# Patient Record
Sex: Female | Born: 1960 | Race: White | Hispanic: No | Marital: Single
Health system: Southern US, Community
[De-identification: ages and names within clinical notes are randomized; demographics above are authoritative.]

---

## 2010-04-28 ENCOUNTER — Inpatient Hospital Stay: Payer: Self-pay | Admitting: Internal Medicine

## 2010-06-01 ENCOUNTER — Emergency Department: Payer: Self-pay | Admitting: Emergency Medicine

## 2010-06-02 ENCOUNTER — Emergency Department: Payer: Self-pay | Admitting: Emergency Medicine

## 2010-06-04 ENCOUNTER — Inpatient Hospital Stay: Payer: Self-pay | Admitting: Surgery

## 2010-06-08 ENCOUNTER — Inpatient Hospital Stay: Payer: Self-pay | Admitting: Surgery

## 2010-10-22 ENCOUNTER — Emergency Department: Payer: Self-pay | Admitting: Emergency Medicine

## 2011-05-12 ENCOUNTER — Emergency Department: Payer: Self-pay | Admitting: Emergency Medicine

## 2011-05-12 LAB — COMPREHENSIVE METABOLIC PANEL
Albumin: 3.8 g/dL (ref 3.4–5.0)
Alkaline Phosphatase: 92 U/L (ref 50–136)
Anion Gap: 12 (ref 7–16)
BUN: 28 mg/dL — ABNORMAL HIGH (ref 7–18)
Bilirubin,Total: 0.3 mg/dL (ref 0.2–1.0)
Calcium, Total: 9.6 mg/dL (ref 8.5–10.1)
Creatinine: 0.94 mg/dL (ref 0.60–1.30)
EGFR (African American): >60
EGFR (Non-African Amer.): >60
Glucose: 133 mg/dL — ABNORMAL HIGH (ref 65–99)
Osmolality: 291 (ref 275–301)
Potassium: 3.4 mmol/L — ABNORMAL LOW (ref 3.5–5.1)
SGOT(AST): 19 U/L (ref 15–37)
Sodium: 142 mmol/L (ref 136–145)
Total Protein: 7.6 g/dL (ref 6.4–8.2)

## 2011-05-12 LAB — CBC WITH DIFFERENTIAL/PLATELET
Basophil %: 0 %
Eosinophil #: 0 10*3/uL (ref 0.0–0.7)
HCT: 37 % (ref 35.0–47.0)
HGB: 12.7 g/dL (ref 12.0–16.0)
Lymphocyte %: 14.8 %
MCV: 86 fL (ref 80–100)
Monocyte #: 0.1 10*3/uL (ref 0.0–0.7)
Monocyte %: 0.8 %
Neutrophil #: 10.3 10*3/uL — ABNORMAL HIGH (ref 1.4–6.5)
WBC: 12.3 10*3/uL — ABNORMAL HIGH (ref 3.6–11.0)

## 2011-11-06 ENCOUNTER — Inpatient Hospital Stay: Payer: Self-pay | Admitting: Surgery

## 2011-11-06 LAB — URINALYSIS, COMPLETE
Bilirubin,UR: NEGATIVE
Glucose,UR: NEGATIVE mg/dL (ref 0–75)
Hyaline Cast: 51
Protein: 100
Specific Gravity: 1.024 (ref 1.003–1.030)
WBC UR: 2 /HPF (ref 0–5)

## 2011-11-06 LAB — CBC
HCT: 46.3 % (ref 35.0–47.0)
MCHC: 33.6 g/dL (ref 32.0–36.0)
RDW: 14.8 % — ABNORMAL HIGH (ref 11.5–14.5)

## 2011-11-06 LAB — COMPREHENSIVE METABOLIC PANEL
Anion Gap: 24 — ABNORMAL HIGH (ref 7–16)
Calcium, Total: 10.7 mg/dL — ABNORMAL HIGH (ref 8.5–10.1)
Co2: 16 mmol/L — ABNORMAL LOW (ref 21–32)
EGFR (African American): 54 — ABNORMAL LOW
Glucose: 148 mg/dL — ABNORMAL HIGH (ref 65–99)
Osmolality: 272 (ref 275–301)
SGOT(AST): 37 U/L (ref 15–37)
Sodium: 133 mmol/L — ABNORMAL LOW (ref 136–145)

## 2011-11-06 LAB — LIPASE, BLOOD: Lipase: 174 U/L (ref 73–393)

## 2011-11-07 LAB — CBC WITH DIFFERENTIAL/PLATELET
Basophil %: 0.2 %
Eosinophil #: 0 10*3/uL (ref 0.0–0.7)
Eosinophil %: 0.1 %
HCT: 38.1 % (ref 35.0–47.0)
HGB: 12.7 g/dL (ref 12.0–16.0)
MCH: 29.1 pg (ref 26.0–34.0)
MCHC: 33.4 g/dL (ref 32.0–36.0)
Monocyte #: 1.2 x10 3/mm — ABNORMAL HIGH (ref 0.2–0.9)
Monocyte %: 7.3 %
Neutrophil #: 13.2 10*3/uL — ABNORMAL HIGH (ref 1.4–6.5)
Neutrophil %: 79.1 %
RBC: 4.37 10*6/uL (ref 3.80–5.20)

## 2011-11-07 LAB — BASIC METABOLIC PANEL
Anion Gap: 14 (ref 7–16)
BUN: 23 mg/dL — ABNORMAL HIGH (ref 7–18)
Calcium, Total: 8.6 mg/dL (ref 8.5–10.1)
Chloride: 102 mmol/L (ref 98–107)
Creatinine: 1.02 mg/dL (ref 0.60–1.30)
EGFR (African American): >60
EGFR (Non-African Amer.): >60
Osmolality: 285 (ref 275–301)

## 2011-11-08 LAB — CBC WITH DIFFERENTIAL/PLATELET
Basophil #: 0 10*3/uL (ref 0.0–0.1)
Basophil %: 0.3 %
Eosinophil #: 0 10*3/uL (ref 0.0–0.7)
HGB: 11.8 g/dL — ABNORMAL LOW (ref 12.0–16.0)
Lymphocyte #: 3 10*3/uL (ref 1.0–3.6)
Lymphocyte %: 24.7 %
MCH: 29.4 pg (ref 26.0–34.0)
MCHC: 33.8 g/dL (ref 32.0–36.0)
MCV: 87 fL (ref 80–100)
Monocyte #: 0.8 x10 3/mm (ref 0.2–0.9)
Neutrophil %: 67.9 %
Platelet: 175 10*3/uL (ref 150–440)

## 2011-11-08 LAB — BASIC METABOLIC PANEL
BUN: 21 mg/dL — ABNORMAL HIGH (ref 7–18)
Calcium, Total: 8.5 mg/dL (ref 8.5–10.1)
Chloride: 105 mmol/L (ref 98–107)
Co2: 27 mmol/L (ref 21–32)
EGFR (African American): >60
EGFR (Non-African Amer.): >60
Osmolality: 287 (ref 275–301)

## 2011-11-09 LAB — POTASSIUM: Potassium: 3.2 mmol/L — ABNORMAL LOW (ref 3.5–5.1)

## 2011-11-11 LAB — CBC WITH DIFFERENTIAL/PLATELET
Basophil #: 0.1 10*3/uL (ref 0.0–0.1)
Basophil %: 0.8 %
Eosinophil #: 0.3 10*3/uL (ref 0.0–0.7)
HCT: 33.8 % — ABNORMAL LOW (ref 35.0–47.0)
HGB: 11.7 g/dL — ABNORMAL LOW (ref 12.0–16.0)
Lymphocyte #: 2.4 10*3/uL (ref 1.0–3.6)
Lymphocyte %: 28.9 %
MCH: 30 pg (ref 26.0–34.0)
MCHC: 34.6 g/dL (ref 32.0–36.0)
MCV: 87 fL (ref 80–100)
Monocyte #: 0.9 x10 3/mm (ref 0.2–0.9)
Monocyte %: 10.9 %
Neutrophil #: 4.7 10*3/uL (ref 1.4–6.5)
Platelet: 179 10*3/uL (ref 150–440)
RDW: 14 % (ref 11.5–14.5)
WBC: 8.3 10*3/uL (ref 3.6–11.0)

## 2011-11-11 LAB — BASIC METABOLIC PANEL
BUN: 8 mg/dL (ref 7–18)
Calcium, Total: 7.8 mg/dL — ABNORMAL LOW (ref 8.5–10.1)
Chloride: 105 mmol/L (ref 98–107)
EGFR (Non-African Amer.): >60
Glucose: 79 mg/dL (ref 65–99)
Osmolality: 279 (ref 275–301)
Potassium: 3.1 mmol/L — ABNORMAL LOW (ref 3.5–5.1)
Sodium: 141 mmol/L (ref 136–145)

## 2011-11-11 LAB — AMMONIA: Ammonia, Plasma: 26 mcmol/L (ref 11–32)

## 2011-11-11 LAB — MAGNESIUM: Magnesium: 1.6 mg/dL — ABNORMAL LOW

## 2011-11-12 LAB — HEMOGLOBIN: HGB: 11.9 g/dL — ABNORMAL LOW (ref 12.0–16.0)

## 2011-11-12 LAB — MAGNESIUM: Magnesium: 1.8 mg/dL

## 2012-07-28 ENCOUNTER — Emergency Department: Payer: Self-pay | Admitting: Emergency Medicine

## 2012-07-28 LAB — COMPREHENSIVE METABOLIC PANEL
Alkaline Phosphatase: 85 U/L (ref 50–136)
Anion Gap: 18 — ABNORMAL HIGH (ref 7–16)
BUN: 26 mg/dL — ABNORMAL HIGH (ref 7–18)
Bilirubin,Total: 0.5 mg/dL (ref 0.2–1.0)
Chloride: 98 mmol/L (ref 98–107)
Co2: 18 mmol/L — ABNORMAL LOW (ref 21–32)
Creatinine: 0.97 mg/dL (ref 0.60–1.30)
Glucose: 128 mg/dL — ABNORMAL HIGH (ref 65–99)
Osmolality: 275 (ref 275–301)
SGOT(AST): 29 U/L (ref 15–37)
SGPT (ALT): 35 U/L (ref 12–78)
Total Protein: 8.3 g/dL — ABNORMAL HIGH (ref 6.4–8.2)

## 2012-07-28 LAB — URINALYSIS, COMPLETE
Bilirubin,UR: NEGATIVE
Blood: NEGATIVE
Protein: 100
Specific Gravity: 1.03 (ref 1.003–1.030)
Squamous Epithelial: 3
WBC UR: 7 /HPF (ref 0–5)

## 2012-07-28 LAB — DRUG SCREEN, URINE
Cannabinoid 50 Ng, Ur ~~LOC~~: POSITIVE (ref ?–50)
Cocaine Metabolite,Ur ~~LOC~~: NEGATIVE (ref ?–300)
MDMA (Ecstasy)Ur Screen: NEGATIVE (ref ?–500)
Methadone, Ur Screen: NEGATIVE (ref ?–300)
Tricyclic, Ur Screen: NEGATIVE (ref ?–1000)

## 2012-07-28 LAB — CBC
MCH: 29.4 pg (ref 26.0–34.0)
MCV: 86 fL (ref 80–100)
Platelet: 339 10*3/uL (ref 150–440)
RDW: 14.2 % (ref 11.5–14.5)

## 2012-07-28 LAB — PROTIME-INR
INR: 0.9
Prothrombin Time: 12.8 secs (ref 11.5–14.7)

## 2012-07-28 LAB — ETHANOL
Ethanol %: <0.003 % (ref 0.000–0.080)
Ethanol: <3 mg/dL

## 2012-09-15 ENCOUNTER — Emergency Department: Payer: Self-pay | Admitting: Internal Medicine

## 2012-09-15 LAB — COMPREHENSIVE METABOLIC PANEL
Alkaline Phosphatase: 101 U/L (ref 50–136)
BUN: 13 mg/dL (ref 7–18)
Bilirubin,Total: 0.6 mg/dL (ref 0.2–1.0)
Chloride: 99 mmol/L (ref 98–107)
Co2: 21 mmol/L (ref 21–32)
EGFR (Non-African Amer.): 56 — ABNORMAL LOW
Glucose: 154 mg/dL — ABNORMAL HIGH (ref 65–99)
Potassium: 3.3 mmol/L — ABNORMAL LOW (ref 3.5–5.1)

## 2012-09-15 LAB — LIPASE, BLOOD: Lipase: 100 U/L (ref 73–393)

## 2012-09-15 LAB — HEMOGLOBIN: HGB: 14.3 g/dL (ref 12.0–16.0)

## 2012-11-07 ENCOUNTER — Emergency Department: Payer: Self-pay

## 2012-11-07 LAB — URINALYSIS, COMPLETE
Bacteria: NONE SEEN
Bilirubin,UR: NEGATIVE
Glucose,UR: NEGATIVE mg/dL (ref 0–75)
Nitrite: NEGATIVE
Ph: 5 (ref 4.5–8.0)
Protein: 30
Specific Gravity: 1.016 (ref 1.003–1.030)
Squamous Epithelial: <1
WBC UR: 1 /HPF (ref 0–5)

## 2012-11-07 LAB — COMPREHENSIVE METABOLIC PANEL
Anion Gap: 25 — ABNORMAL HIGH (ref 7–16)
Bilirubin,Total: 1 mg/dL (ref 0.2–1.0)
Calcium, Total: 9.4 mg/dL (ref 8.5–10.1)
Chloride: 90 mmol/L — ABNORMAL LOW (ref 98–107)
Co2: 11 mmol/L — ABNORMAL LOW (ref 21–32)
Creatinine: 0.82 mg/dL (ref 0.60–1.30)
EGFR (African American): >60
Glucose: 92 mg/dL (ref 65–99)
Osmolality: 252 (ref 275–301)
SGOT(AST): 50 U/L — ABNORMAL HIGH (ref 15–37)
SGPT (ALT): 36 U/L (ref 12–78)
Total Protein: 8 g/dL (ref 6.4–8.2)

## 2012-11-07 LAB — CBC
HCT: 41.1 % (ref 35.0–47.0)
HGB: 14.4 g/dL (ref 12.0–16.0)
MCHC: 35 g/dL (ref 32.0–36.0)
MCV: 85 fL (ref 80–100)
Platelet: 376 10*3/uL (ref 150–440)
RBC: 4.83 10*6/uL (ref 3.80–5.20)
RDW: 15.7 % — ABNORMAL HIGH (ref 11.5–14.5)
WBC: 16.3 10*3/uL — ABNORMAL HIGH (ref 3.6–11.0)

## 2012-11-07 LAB — ETHANOL: Ethanol %: <0.003 % (ref 0.000–0.080)

## 2012-11-07 LAB — CK TOTAL AND CKMB (NOT AT ARMC): CK-MB: 2.8 ng/mL (ref 0.5–3.6)

## 2012-11-25 ENCOUNTER — Emergency Department: Payer: Self-pay | Admitting: Emergency Medicine

## 2012-11-25 LAB — CBC
HCT: 43.3 % (ref 35.0–47.0)
MCHC: 34.5 g/dL (ref 32.0–36.0)
Platelet: 391 10*3/uL (ref 150–440)
RDW: 16.7 % — ABNORMAL HIGH (ref 11.5–14.5)

## 2012-11-25 LAB — COMPREHENSIVE METABOLIC PANEL
Albumin: 3.8 g/dL (ref 3.4–5.0)
Alkaline Phosphatase: 117 U/L (ref 50–136)
BUN: 6 mg/dL — ABNORMAL LOW (ref 7–18)
Calcium, Total: 9.4 mg/dL (ref 8.5–10.1)
Chloride: 92 mmol/L — ABNORMAL LOW (ref 98–107)
Co2: 20 mmol/L — ABNORMAL LOW (ref 21–32)
EGFR (African American): >60
Glucose: 94 mg/dL (ref 65–99)
Osmolality: 253 (ref 275–301)
Potassium: 3.5 mmol/L (ref 3.5–5.1)
SGOT(AST): 69 U/L — ABNORMAL HIGH (ref 15–37)
Sodium: 127 mmol/L — ABNORMAL LOW (ref 136–145)
Total Protein: 8.1 g/dL (ref 6.4–8.2)

## 2012-11-25 LAB — TSH: Thyroid Stimulating Horm: 5.69 u[IU]/mL — ABNORMAL HIGH

## 2012-11-25 LAB — URINALYSIS, COMPLETE
Bilirubin,UR: NEGATIVE
Blood: NEGATIVE
Nitrite: NEGATIVE
Ph: 7 (ref 4.5–8.0)
RBC,UR: NONE SEEN /HPF (ref 0–5)

## 2012-11-25 LAB — SALICYLATE LEVEL: Salicylates, Serum: 7.5 mg/dL — ABNORMAL HIGH

## 2012-11-25 LAB — ACETAMINOPHEN LEVEL: Acetaminophen: <2 ug/mL

## 2012-11-26 LAB — DRUG SCREEN, URINE
Amphetamines, Ur Screen: NEGATIVE (ref ?–1000)
Benzodiazepine, Ur Scrn: POSITIVE (ref ?–200)
Cannabinoid 50 Ng, Ur ~~LOC~~: POSITIVE (ref ?–50)
Cocaine Metabolite,Ur ~~LOC~~: NEGATIVE (ref ?–300)
MDMA (Ecstasy)Ur Screen: NEGATIVE (ref ?–500)
Methadone, Ur Screen: NEGATIVE (ref ?–300)
Opiate, Ur Screen: NEGATIVE (ref ?–300)

## 2012-11-26 LAB — SALICYLATE LEVEL
Salicylates, Serum: 2.7 mg/dL
Salicylates, Serum: <1.7 mg/dL

## 2013-04-23 ENCOUNTER — Emergency Department: Payer: Self-pay | Admitting: Emergency Medicine

## 2013-04-23 LAB — COMPREHENSIVE METABOLIC PANEL
Albumin: 4.2 g/dL (ref 3.4–5.0)
Alkaline Phosphatase: 103 U/L
Anion Gap: 8 (ref 7–16)
BUN: 15 mg/dL (ref 7–18)
Bilirubin,Total: 0.3 mg/dL (ref 0.2–1.0)
Co2: 27 mmol/L (ref 21–32)
EGFR (African American): >60
Glucose: 139 mg/dL — ABNORMAL HIGH (ref 65–99)
Potassium: 3.2 mmol/L — ABNORMAL LOW (ref 3.5–5.1)
SGOT(AST): 26 U/L (ref 15–37)
SGPT (ALT): 24 U/L (ref 12–78)
Sodium: 133 mmol/L — ABNORMAL LOW (ref 136–145)
Total Protein: 8.4 g/dL — ABNORMAL HIGH (ref 6.4–8.2)

## 2013-04-23 LAB — URINALYSIS, COMPLETE
Bacteria: NONE SEEN
Bilirubin,UR: NEGATIVE
Glucose,UR: NEGATIVE mg/dL (ref 0–75)
Nitrite: NEGATIVE
Squamous Epithelial: <1
WBC UR: 1 /HPF (ref 0–5)

## 2013-04-23 LAB — RAPID INFLUENZA A&B ANTIGENS

## 2013-04-23 LAB — TROPONIN I: Troponin-I: <0.02 ng/mL

## 2013-04-23 LAB — CBC
HGB: 13.7 g/dL (ref 12.0–16.0)
MCH: 27.2 pg (ref 26.0–34.0)
MCHC: 31.5 g/dL — ABNORMAL LOW (ref 32.0–36.0)
Platelet: 321 10*3/uL (ref 150–440)

## 2013-05-20 ENCOUNTER — Emergency Department: Payer: Self-pay | Admitting: Emergency Medicine

## 2013-05-20 LAB — COMPREHENSIVE METABOLIC PANEL
ALBUMIN: 4 g/dL (ref 3.4–5.0)
ALK PHOS: 114 U/L
ANION GAP: 10 (ref 7–16)
AST: 36 U/L (ref 15–37)
BILIRUBIN TOTAL: 0.5 mg/dL (ref 0.2–1.0)
BUN: 16 mg/dL (ref 7–18)
CALCIUM: 9.9 mg/dL (ref 8.5–10.1)
CREATININE: 0.97 mg/dL (ref 0.60–1.30)
Chloride: 94 mmol/L — ABNORMAL LOW (ref 98–107)
Co2: 25 mmol/L (ref 21–32)
EGFR (African American): >60
EGFR (Non-African Amer.): >60
Glucose: 115 mg/dL — ABNORMAL HIGH (ref 65–99)
OSMOLALITY: 261 (ref 275–301)
Potassium: 4.2 mmol/L (ref 3.5–5.1)
SGPT (ALT): 27 U/L (ref 12–78)
Sodium: 129 mmol/L — ABNORMAL LOW (ref 136–145)
Total Protein: 8.8 g/dL — ABNORMAL HIGH (ref 6.4–8.2)

## 2013-05-20 LAB — URINALYSIS, COMPLETE
Bacteria: NONE SEEN
Bilirubin,UR: NEGATIVE
Blood: NEGATIVE
Glucose,UR: NEGATIVE mg/dL (ref 0–75)
Leukocyte Esterase: NEGATIVE
NITRITE: NEGATIVE
Ph: 8 (ref 4.5–8.0)
RBC,UR: 2 /HPF (ref 0–5)
SPECIFIC GRAVITY: 1.018 (ref 1.003–1.030)

## 2013-05-20 LAB — CBC WITH DIFFERENTIAL/PLATELET
BASOS PCT: 0.9 %
Basophil #: 0.1 10*3/uL (ref 0.0–0.1)
EOS PCT: 0.2 %
Eosinophil #: 0 10*3/uL (ref 0.0–0.7)
HCT: 44.7 % (ref 35.0–47.0)
HGB: 15.3 g/dL (ref 12.0–16.0)
LYMPHS ABS: 2 10*3/uL (ref 1.0–3.6)
Lymphocyte %: 14.8 %
MCH: 29.8 pg (ref 26.0–34.0)
MCHC: 34.2 g/dL (ref 32.0–36.0)
MCV: 87 fL (ref 80–100)
Monocyte #: 0.5 x10 3/mm (ref 0.2–0.9)
Monocyte %: 3.4 %
Neutrophil #: 10.9 10*3/uL — ABNORMAL HIGH (ref 1.4–6.5)
Neutrophil %: 80.7 %
Platelet: 323 10*3/uL (ref 150–440)
RBC: 5.13 10*6/uL (ref 3.80–5.20)
RDW: 13.4 % (ref 11.5–14.5)
WBC: 13.5 10*3/uL — ABNORMAL HIGH (ref 3.6–11.0)

## 2013-05-20 LAB — ETHANOL
Ethanol %: <0.003 % (ref 0.000–0.080)
Ethanol: <3 mg/dL

## 2013-05-20 LAB — LIPASE, BLOOD: LIPASE: 201 U/L (ref 73–393)

## 2013-05-25 ENCOUNTER — Emergency Department: Payer: Self-pay | Admitting: Emergency Medicine

## 2013-05-25 LAB — URINALYSIS, COMPLETE
Bilirubin,UR: NEGATIVE
Blood: NEGATIVE
Glucose,UR: NEGATIVE mg/dL (ref 0–75)
Ketone: NEGATIVE
Nitrite: NEGATIVE
Ph: 7 (ref 4.5–8.0)
Protein: NEGATIVE
RBC,UR: 2 /HPF (ref 0–5)
Specific Gravity: 1.003 (ref 1.003–1.030)
Squamous Epithelial: 3

## 2013-05-25 LAB — ACETAMINOPHEN LEVEL: Acetaminophen: <2 ug/mL

## 2013-05-25 LAB — DRUG SCREEN, URINE
AMPHETAMINES, UR SCREEN: NEGATIVE (ref ?–1000)
Barbiturates, Ur Screen: NEGATIVE (ref ?–200)
Benzodiazepine, Ur Scrn: NEGATIVE (ref ?–200)
CANNABINOID 50 NG, UR ~~LOC~~: POSITIVE (ref ?–50)
Cocaine Metabolite,Ur ~~LOC~~: NEGATIVE (ref ?–300)
MDMA (Ecstasy)Ur Screen: NEGATIVE (ref ?–500)
METHADONE, UR SCREEN: NEGATIVE (ref ?–300)
Opiate, Ur Screen: NEGATIVE (ref ?–300)
Phencyclidine (PCP) Ur S: NEGATIVE (ref ?–25)
Tricyclic, Ur Screen: NEGATIVE (ref ?–1000)

## 2013-05-25 LAB — CBC
HCT: 38.1 % (ref 35.0–47.0)
HGB: 12.7 g/dL (ref 12.0–16.0)
MCH: 29.5 pg (ref 26.0–34.0)
MCHC: 33.4 g/dL (ref 32.0–36.0)
MCV: 88 fL (ref 80–100)
PLATELETS: 260 10*3/uL (ref 150–440)
RBC: 4.31 10*6/uL (ref 3.80–5.20)
RDW: 13.7 % (ref 11.5–14.5)
WBC: 12.2 10*3/uL — AB (ref 3.6–11.0)

## 2013-05-25 LAB — COMPREHENSIVE METABOLIC PANEL WITH GFR
Albumin: 3.1 g/dL — ABNORMAL LOW
Alkaline Phosphatase: 104 U/L
Anion Gap: 6 — ABNORMAL LOW
BUN: 10 mg/dL
Bilirubin,Total: 0.2 mg/dL
Calcium, Total: 8.2 mg/dL — ABNORMAL LOW
Chloride: 101 mmol/L
Co2: 26 mmol/L
Creatinine: 0.85 mg/dL
EGFR (African American): >60
EGFR (Non-African Amer.): >60
Glucose: 89 mg/dL
Osmolality: 265
Potassium: 3.5 mmol/L
SGOT(AST): 23 U/L
SGPT (ALT): 19 U/L
Sodium: 133 mmol/L — ABNORMAL LOW
Total Protein: 7.2 g/dL

## 2013-05-25 LAB — SALICYLATE LEVEL: SALICYLATES, SERUM: 2.2 mg/dL

## 2013-05-25 LAB — ETHANOL
Ethanol %: 0.045 % (ref 0.000–0.080)
Ethanol: 45 mg/dL

## 2013-05-28 ENCOUNTER — Emergency Department: Payer: Self-pay | Admitting: Emergency Medicine

## 2013-05-28 LAB — CBC WITH DIFFERENTIAL/PLATELET
Basophil #: 0.1 10*3/uL (ref 0.0–0.1)
Basophil %: 1 %
EOS ABS: 0.3 10*3/uL (ref 0.0–0.7)
Eosinophil %: 3 %
HCT: 35.4 % (ref 35.0–47.0)
HGB: 12.3 g/dL (ref 12.0–16.0)
LYMPHS ABS: 2.7 10*3/uL (ref 1.0–3.6)
Lymphocyte %: 27.8 %
MCH: 30.7 pg (ref 26.0–34.0)
MCHC: 34.9 g/dL (ref 32.0–36.0)
MCV: 88 fL (ref 80–100)
MONO ABS: 0.8 x10 3/mm (ref 0.2–0.9)
MONOS PCT: 7.7 %
NEUTROS ABS: 6 10*3/uL (ref 1.4–6.5)
Neutrophil %: 60.5 %
Platelet: 279 10*3/uL (ref 150–440)
RBC: 4.03 10*6/uL (ref 3.80–5.20)
RDW: 13.5 % (ref 11.5–14.5)
WBC: 9.9 10*3/uL (ref 3.6–11.0)

## 2013-05-28 LAB — COMPREHENSIVE METABOLIC PANEL
ALK PHOS: 92 U/L
Albumin: 3 g/dL — ABNORMAL LOW (ref 3.4–5.0)
Anion Gap: 3 — ABNORMAL LOW (ref 7–16)
BUN: 15 mg/dL (ref 7–18)
Bilirubin,Total: 0.2 mg/dL (ref 0.2–1.0)
CHLORIDE: 99 mmol/L (ref 98–107)
Calcium, Total: 9.1 mg/dL (ref 8.5–10.1)
Co2: 30 mmol/L (ref 21–32)
Creatinine: 1.14 mg/dL (ref 0.60–1.30)
EGFR (African American): >60
GFR CALC NON AF AMER: 55 — AB
Glucose: 83 mg/dL (ref 65–99)
OSMOLALITY: 264 (ref 275–301)
POTASSIUM: 4.2 mmol/L (ref 3.5–5.1)
SGOT(AST): 20 U/L (ref 15–37)
SGPT (ALT): 15 U/L (ref 12–78)
SODIUM: 132 mmol/L — AB (ref 136–145)
TOTAL PROTEIN: 7.2 g/dL (ref 6.4–8.2)

## 2013-06-05 ENCOUNTER — Ambulatory Visit: Payer: Self-pay | Admitting: Otolaryngology

## 2013-07-01 ENCOUNTER — Inpatient Hospital Stay: Payer: Self-pay | Admitting: Internal Medicine

## 2013-07-01 LAB — CBC
HCT: 44.5 % (ref 35.0–47.0)
HGB: 14.9 g/dL (ref 12.0–16.0)
MCH: 28.7 pg (ref 26.0–34.0)
MCHC: 33.5 g/dL (ref 32.0–36.0)
MCV: 86 fL (ref 80–100)
Platelet: 352 10*3/uL (ref 150–440)
RBC: 5.19 10*6/uL (ref 3.80–5.20)
RDW: 13.2 % (ref 11.5–14.5)
WBC: 14.6 10*3/uL — ABNORMAL HIGH (ref 3.6–11.0)

## 2013-07-01 LAB — DRUG SCREEN, URINE
AMPHETAMINES, UR SCREEN: NEGATIVE (ref ?–1000)
Barbiturates, Ur Screen: NEGATIVE (ref ?–200)
Benzodiazepine, Ur Scrn: NEGATIVE (ref ?–200)
COCAINE METABOLITE, UR ~~LOC~~: NEGATIVE (ref ?–300)
Cannabinoid 50 Ng, Ur ~~LOC~~: POSITIVE (ref ?–50)
MDMA (ECSTASY) UR SCREEN: NEGATIVE (ref ?–500)
Methadone, Ur Screen: NEGATIVE (ref ?–300)
Opiate, Ur Screen: NEGATIVE (ref ?–300)
PHENCYCLIDINE (PCP) UR S: NEGATIVE (ref ?–25)
Tricyclic, Ur Screen: POSITIVE (ref ?–1000)

## 2013-07-01 LAB — COMPREHENSIVE METABOLIC PANEL
ALK PHOS: 136 U/L — AB
ALT: 32 U/L (ref 12–78)
ANION GAP: 9 (ref 7–16)
AST: 80 U/L — AB (ref 15–37)
Albumin: 3.7 g/dL (ref 3.4–5.0)
BUN: 30 mg/dL — ABNORMAL HIGH (ref 7–18)
Bilirubin,Total: 0.5 mg/dL (ref 0.2–1.0)
CO2: 26 mmol/L (ref 21–32)
CREATININE: 1.24 mg/dL (ref 0.60–1.30)
Calcium, Total: 9.2 mg/dL (ref 8.5–10.1)
Chloride: 100 mmol/L (ref 98–107)
EGFR (African American): 57 — ABNORMAL LOW
GFR CALC NON AF AMER: 50 — AB
Glucose: 117 mg/dL — ABNORMAL HIGH (ref 65–99)
OSMOLALITY: 277 (ref 275–301)
POTASSIUM: 3.6 mmol/L (ref 3.5–5.1)
Sodium: 135 mmol/L — ABNORMAL LOW (ref 136–145)
TOTAL PROTEIN: 8.3 g/dL — AB (ref 6.4–8.2)

## 2013-07-01 LAB — TROPONIN I: Troponin-I: <0.02 ng/mL

## 2013-07-01 LAB — URINALYSIS, COMPLETE
Bacteria: NONE SEEN
Bilirubin,UR: NEGATIVE
Blood: NEGATIVE
Glucose,UR: NEGATIVE mg/dL (ref 0–75)
Hyaline Cast: 3
Nitrite: NEGATIVE
Ph: 5 (ref 4.5–8.0)
Protein: 30
RBC,UR: 1 /HPF (ref 0–5)
Specific Gravity: 1.031 (ref 1.003–1.030)
Squamous Epithelial: <1
WBC UR: 2 /HPF (ref 0–5)

## 2013-07-01 LAB — TRIGLYCERIDES: Triglycerides: 197 mg/dL (ref 0–200)

## 2013-07-01 LAB — MAGNESIUM: MAGNESIUM: 1.7 mg/dL — AB

## 2013-07-01 LAB — ETHANOL: Ethanol: <3 mg/dL

## 2013-07-01 LAB — LIPASE, BLOOD: Lipase: >10000 U/L — ABNORMAL HIGH (ref 73–393)

## 2013-07-02 LAB — CBC WITH DIFFERENTIAL/PLATELET
BASOS ABS: 0 10*3/uL (ref 0.0–0.1)
Basophil %: 0.2 %
EOS ABS: 1.5 10*3/uL — AB (ref 0.0–0.7)
Eosinophil %: 14.9 %
HCT: 37.1 % (ref 35.0–47.0)
HGB: 12.8 g/dL (ref 12.0–16.0)
Lymphocyte #: 1.8 10*3/uL (ref 1.0–3.6)
Lymphocyte %: 18.2 %
MCH: 29.5 pg (ref 26.0–34.0)
MCHC: 34.6 g/dL (ref 32.0–36.0)
MCV: 85 fL (ref 80–100)
Monocyte #: 0.4 x10 3/mm (ref 0.2–0.9)
Monocyte %: 4.4 %
Neutrophil #: 6.3 10*3/uL (ref 1.4–6.5)
Neutrophil %: 62.3 %
PLATELETS: 265 10*3/uL (ref 150–440)
RBC: 4.35 10*6/uL (ref 3.80–5.20)
RDW: 13.4 % (ref 11.5–14.5)
WBC: 10.1 10*3/uL (ref 3.6–11.0)

## 2013-07-02 LAB — BASIC METABOLIC PANEL
Anion Gap: 5 — ABNORMAL LOW (ref 7–16)
BUN: 26 mg/dL — ABNORMAL HIGH (ref 7–18)
CALCIUM: 8.6 mg/dL (ref 8.5–10.1)
CO2: 28 mmol/L (ref 21–32)
Chloride: 105 mmol/L (ref 98–107)
Creatinine: 0.88 mg/dL (ref 0.60–1.30)
EGFR (African American): >60
EGFR (Non-African Amer.): >60
GLUCOSE: 85 mg/dL (ref 65–99)
OSMOLALITY: 280 (ref 275–301)
Potassium: 3.8 mmol/L (ref 3.5–5.1)
SODIUM: 138 mmol/L (ref 136–145)

## 2013-07-02 LAB — LIPASE, BLOOD: Lipase: 1391 U/L — ABNORMAL HIGH (ref 73–393)

## 2013-07-03 LAB — HEPATIC FUNCTION PANEL A (ARMC)
ALK PHOS: 128 U/L — AB
ALT: 71 U/L (ref 12–78)
AST: 54 U/L — AB (ref 15–37)
Albumin: 2.8 g/dL — ABNORMAL LOW (ref 3.4–5.0)
BILIRUBIN TOTAL: 0.3 mg/dL (ref 0.2–1.0)
TOTAL PROTEIN: 5.7 g/dL — AB (ref 6.4–8.2)

## 2013-07-03 LAB — LIPASE, BLOOD: LIPASE: 305 U/L (ref 73–393)

## 2013-07-15 ENCOUNTER — Inpatient Hospital Stay: Payer: Self-pay | Admitting: Otolaryngology

## 2013-07-18 LAB — PATHOLOGY REPORT

## 2013-07-25 ENCOUNTER — Ambulatory Visit: Payer: Self-pay | Admitting: Oncology

## 2013-07-30 ENCOUNTER — Ambulatory Visit: Payer: Self-pay | Admitting: Oncology

## 2013-08-19 ENCOUNTER — Ambulatory Visit: Payer: Self-pay | Admitting: Oncology

## 2013-08-21 ENCOUNTER — Ambulatory Visit: Payer: Self-pay | Admitting: Vascular Surgery

## 2013-08-22 ENCOUNTER — Ambulatory Visit: Payer: Self-pay | Admitting: Oncology

## 2013-08-29 ENCOUNTER — Ambulatory Visit: Payer: Self-pay | Admitting: Oncology

## 2013-09-30 ENCOUNTER — Ambulatory Visit: Payer: Self-pay | Admitting: Oncology

## 2013-10-06 LAB — COMPREHENSIVE METABOLIC PANEL
ALT: 20 U/L (ref 12–78)
ANION GAP: 9 (ref 7–16)
Albumin: 3.5 g/dL (ref 3.4–5.0)
Alkaline Phosphatase: 88 U/L
BUN: 15 mg/dL (ref 7–18)
Bilirubin,Total: 0.3 mg/dL (ref 0.2–1.0)
CHLORIDE: 100 mmol/L (ref 98–107)
CREATININE: 1.04 mg/dL (ref 0.60–1.30)
Calcium, Total: 9.2 mg/dL (ref 8.5–10.1)
Co2: 27 mmol/L (ref 21–32)
EGFR (Non-African Amer.): >60
Glucose: 88 mg/dL (ref 65–99)
Osmolality: 272 (ref 275–301)
Potassium: 4.3 mmol/L (ref 3.5–5.1)
SGOT(AST): 15 U/L (ref 15–37)
SODIUM: 136 mmol/L (ref 136–145)
Total Protein: 7.2 g/dL (ref 6.4–8.2)

## 2013-10-06 LAB — CBC CANCER CENTER
BASOS ABS: 0.1 x10 3/mm (ref 0.0–0.1)
Basophil %: 1.1 %
Eosinophil #: 0.4 x10 3/mm (ref 0.0–0.7)
Eosinophil %: 4.8 %
HCT: 35.6 % (ref 35.0–47.0)
HGB: 12.2 g/dL (ref 12.0–16.0)
Lymphocyte #: 2.6 x10 3/mm (ref 1.0–3.6)
Lymphocyte %: 31.3 %
MCH: 28.4 pg (ref 26.0–34.0)
MCHC: 34.3 g/dL (ref 32.0–36.0)
MCV: 83 fL (ref 80–100)
Monocyte #: 0.5 x10 3/mm (ref 0.2–0.9)
Monocyte %: 6.2 %
NEUTROS PCT: 56.6 %
Neutrophil #: 4.6 x10 3/mm (ref 1.4–6.5)
PLATELETS: 296 x10 3/mm (ref 150–440)
RBC: 4.32 10*6/uL (ref 3.80–5.20)
RDW: 14.3 % (ref 11.5–14.5)
WBC: 8.2 x10 3/mm (ref 3.6–11.0)

## 2013-10-13 LAB — COMPREHENSIVE METABOLIC PANEL
Albumin: 3 g/dL — ABNORMAL LOW (ref 3.4–5.0)
Alkaline Phosphatase: 85 U/L
Anion Gap: 6 — ABNORMAL LOW (ref 7–16)
BILIRUBIN TOTAL: 0.2 mg/dL (ref 0.2–1.0)
BUN: 17 mg/dL (ref 7–18)
CALCIUM: 8.7 mg/dL (ref 8.5–10.1)
CHLORIDE: 101 mmol/L (ref 98–107)
Co2: 29 mmol/L (ref 21–32)
Creatinine: 0.85 mg/dL (ref 0.60–1.30)
EGFR (African American): >60
EGFR (Non-African Amer.): >60
Glucose: 95 mg/dL (ref 65–99)
Osmolality: 273 (ref 275–301)
Potassium: 4 mmol/L (ref 3.5–5.1)
SGOT(AST): 10 U/L — ABNORMAL LOW (ref 15–37)
SGPT (ALT): 16 U/L (ref 12–78)
Sodium: 136 mmol/L (ref 136–145)
TOTAL PROTEIN: 6.5 g/dL (ref 6.4–8.2)

## 2013-10-13 LAB — CBC CANCER CENTER
Basophil #: 0.1 x10 3/mm (ref 0.0–0.1)
Basophil %: 0.8 %
EOS ABS: 0.3 x10 3/mm (ref 0.0–0.7)
Eosinophil %: 2.5 %
HCT: 33.4 % — ABNORMAL LOW (ref 35.0–47.0)
HGB: 11.2 g/dL — ABNORMAL LOW (ref 12.0–16.0)
LYMPHS ABS: 2.8 x10 3/mm (ref 1.0–3.6)
Lymphocyte %: 24.3 %
MCH: 28 pg (ref 26.0–34.0)
MCHC: 33.6 g/dL (ref 32.0–36.0)
MCV: 83 fL (ref 80–100)
MONO ABS: 0.9 x10 3/mm (ref 0.2–0.9)
Monocyte %: 7.5 %
Neutrophil #: 7.6 x10 3/mm — ABNORMAL HIGH (ref 1.4–6.5)
Neutrophil %: 64.9 %
Platelet: 275 x10 3/mm (ref 150–440)
RBC: 4 10*6/uL (ref 3.80–5.20)
RDW: 14 % (ref 11.5–14.5)
WBC: 11.7 x10 3/mm — ABNORMAL HIGH (ref 3.6–11.0)

## 2013-10-20 LAB — COMPREHENSIVE METABOLIC PANEL
ALT: 19 U/L (ref 12–78)
ANION GAP: 9 (ref 7–16)
Albumin: 3 g/dL — ABNORMAL LOW (ref 3.4–5.0)
Alkaline Phosphatase: 93 U/L
BILIRUBIN TOTAL: 0.1 mg/dL — AB (ref 0.2–1.0)
BUN: 18 mg/dL (ref 7–18)
CREATININE: 0.94 mg/dL (ref 0.60–1.30)
Calcium, Total: 8.8 mg/dL (ref 8.5–10.1)
Chloride: 100 mmol/L (ref 98–107)
Co2: 26 mmol/L (ref 21–32)
EGFR (African American): >60
EGFR (Non-African Amer.): >60
GLUCOSE: 111 mg/dL — AB (ref 65–99)
Osmolality: 273 (ref 275–301)
POTASSIUM: 4.3 mmol/L (ref 3.5–5.1)
SGOT(AST): 13 U/L — ABNORMAL LOW (ref 15–37)
Sodium: 135 mmol/L — ABNORMAL LOW (ref 136–145)
Total Protein: 6.5 g/dL (ref 6.4–8.2)

## 2013-10-20 LAB — CBC CANCER CENTER
BASOS ABS: 0.1 x10 3/mm (ref 0.0–0.1)
Basophil %: 1.4 %
Eosinophil #: 0.2 x10 3/mm (ref 0.0–0.7)
Eosinophil %: 2.6 %
HCT: 33 % — AB (ref 35.0–47.0)
HGB: 11.2 g/dL — ABNORMAL LOW (ref 12.0–16.0)
LYMPHS ABS: 2.4 x10 3/mm (ref 1.0–3.6)
Lymphocyte %: 26 %
MCH: 28.5 pg (ref 26.0–34.0)
MCHC: 34.1 g/dL (ref 32.0–36.0)
MCV: 84 fL (ref 80–100)
Monocyte #: 0.5 x10 3/mm (ref 0.2–0.9)
Monocyte %: 5.1 %
Neutrophil #: 6 x10 3/mm (ref 1.4–6.5)
Neutrophil %: 64.9 %
Platelet: 280 x10 3/mm (ref 150–440)
RBC: 3.95 10*6/uL (ref 3.80–5.20)
RDW: 14.7 % — AB (ref 11.5–14.5)
WBC: 9.3 x10 3/mm (ref 3.6–11.0)

## 2013-10-27 LAB — COMPREHENSIVE METABOLIC PANEL
ALBUMIN: 3 g/dL — AB (ref 3.4–5.0)
ALK PHOS: 76 U/L
AST: 26 U/L (ref 15–37)
Anion Gap: 5 — ABNORMAL LOW (ref 7–16)
BILIRUBIN TOTAL: 0.2 mg/dL (ref 0.2–1.0)
BUN: 15 mg/dL (ref 7–18)
CALCIUM: 8.5 mg/dL (ref 8.5–10.1)
CO2: 28 mmol/L (ref 21–32)
Chloride: 98 mmol/L (ref 98–107)
Creatinine: 1.01 mg/dL (ref 0.60–1.30)
GLUCOSE: 84 mg/dL (ref 65–99)
OSMOLALITY: 263 (ref 275–301)
POTASSIUM: 4.1 mmol/L (ref 3.5–5.1)
SGPT (ALT): 21 U/L (ref 12–78)
SODIUM: 131 mmol/L — AB (ref 136–145)
Total Protein: 6.4 g/dL (ref 6.4–8.2)

## 2013-10-27 LAB — CBC CANCER CENTER
BASOS PCT: 0.8 %
Basophil #: 0.1 x10 3/mm (ref 0.0–0.1)
Eosinophil #: 0.2 x10 3/mm (ref 0.0–0.7)
Eosinophil %: 2.5 %
HCT: 30.2 % — ABNORMAL LOW (ref 35.0–47.0)
HGB: 10.3 g/dL — ABNORMAL LOW (ref 12.0–16.0)
Lymphocyte #: 2 x10 3/mm (ref 1.0–3.6)
Lymphocyte %: 20.4 %
MCH: 28.6 pg (ref 26.0–34.0)
MCHC: 34.2 g/dL (ref 32.0–36.0)
MCV: 84 fL (ref 80–100)
MONO ABS: 0.6 x10 3/mm (ref 0.2–0.9)
Monocyte %: 6.4 %
NEUTROS ABS: 7 x10 3/mm — AB (ref 1.4–6.5)
Neutrophil %: 69.9 %
Platelet: 252 x10 3/mm (ref 150–440)
RBC: 3.62 10*6/uL — AB (ref 3.80–5.20)
RDW: 15 % — AB (ref 11.5–14.5)
WBC: 10 x10 3/mm (ref 3.6–11.0)

## 2013-10-29 ENCOUNTER — Ambulatory Visit: Payer: Self-pay | Admitting: Oncology

## 2013-11-03 LAB — COMPREHENSIVE METABOLIC PANEL
AST: 27 U/L (ref 15–37)
Albumin: 3.1 g/dL — ABNORMAL LOW (ref 3.4–5.0)
Alkaline Phosphatase: 76 U/L
Anion Gap: 7 (ref 7–16)
BUN: 9 mg/dL (ref 7–18)
Bilirubin,Total: 0.2 mg/dL (ref 0.2–1.0)
CALCIUM: 8.8 mg/dL (ref 8.5–10.1)
Chloride: 93 mmol/L — ABNORMAL LOW (ref 98–107)
Co2: 29 mmol/L (ref 21–32)
Creatinine: 0.93 mg/dL (ref 0.60–1.30)
EGFR (African American): >60
EGFR (Non-African Amer.): >60
Glucose: 108 mg/dL — ABNORMAL HIGH (ref 65–99)
OSMOLALITY: 258 (ref 275–301)
POTASSIUM: 4.5 mmol/L (ref 3.5–5.1)
SGPT (ALT): 25 U/L (ref 12–78)
SODIUM: 129 mmol/L — AB (ref 136–145)
Total Protein: 6.4 g/dL (ref 6.4–8.2)

## 2013-11-03 LAB — CBC CANCER CENTER
BASOS ABS: 0.1 x10 3/mm (ref 0.0–0.1)
Basophil %: 0.8 %
EOS PCT: 2.8 %
Eosinophil #: 0.2 x10 3/mm (ref 0.0–0.7)
HCT: 30.8 % — AB (ref 35.0–47.0)
HGB: 10.6 g/dL — ABNORMAL LOW (ref 12.0–16.0)
LYMPHS PCT: 13.1 %
Lymphocyte #: 0.8 x10 3/mm — ABNORMAL LOW (ref 1.0–3.6)
MCH: 29 pg (ref 26.0–34.0)
MCHC: 34.5 g/dL (ref 32.0–36.0)
MCV: 84 fL (ref 80–100)
Monocyte #: 0.3 x10 3/mm (ref 0.2–0.9)
Monocyte %: 5.2 %
Neutrophil #: 4.9 x10 3/mm (ref 1.4–6.5)
Neutrophil %: 78.1 %
Platelet: 237 x10 3/mm (ref 150–440)
RBC: 3.67 10*6/uL — AB (ref 3.80–5.20)
RDW: 14.8 % — AB (ref 11.5–14.5)
WBC: 6.3 x10 3/mm (ref 3.6–11.0)

## 2013-11-10 LAB — COMPREHENSIVE METABOLIC PANEL
ALT: 16 U/L (ref 12–78)
ANION GAP: 9 (ref 7–16)
Albumin: 3 g/dL — ABNORMAL LOW (ref 3.4–5.0)
Alkaline Phosphatase: 64 U/L
BUN: 17 mg/dL (ref 7–18)
Bilirubin,Total: 0.2 mg/dL (ref 0.2–1.0)
CALCIUM: 8.4 mg/dL — AB (ref 8.5–10.1)
CHLORIDE: 101 mmol/L (ref 98–107)
CO2: 30 mmol/L (ref 21–32)
CREATININE: 0.86 mg/dL (ref 0.60–1.30)
Glucose: 118 mg/dL — ABNORMAL HIGH (ref 65–99)
Osmolality: 282 (ref 275–301)
Potassium: 4.8 mmol/L (ref 3.5–5.1)
SGOT(AST): 10 U/L — ABNORMAL LOW (ref 15–37)
SODIUM: 140 mmol/L (ref 136–145)
Total Protein: 6.1 g/dL — ABNORMAL LOW (ref 6.4–8.2)

## 2013-11-10 LAB — CBC CANCER CENTER
BASOS ABS: 0 x10 3/mm (ref 0.0–0.1)
Basophil %: 0.1 %
EOS ABS: 0 x10 3/mm (ref 0.0–0.7)
EOS PCT: 0.5 %
HCT: 33.4 % — ABNORMAL LOW (ref 35.0–47.0)
HGB: 11.3 g/dL — AB (ref 12.0–16.0)
LYMPHS ABS: 0.8 x10 3/mm — AB (ref 1.0–3.6)
Lymphocyte %: 9.8 %
MCH: 29.2 pg (ref 26.0–34.0)
MCHC: 33.8 g/dL (ref 32.0–36.0)
MCV: 86 fL (ref 80–100)
MONOS PCT: 6.2 %
Monocyte #: 0.5 x10 3/mm (ref 0.2–0.9)
NEUTROS ABS: 6.6 x10 3/mm — AB (ref 1.4–6.5)
Neutrophil %: 83.4 %
Platelet: 240 x10 3/mm (ref 150–440)
RBC: 3.87 10*6/uL (ref 3.80–5.20)
RDW: 16.2 % — AB (ref 11.5–14.5)
WBC: 7.9 x10 3/mm (ref 3.6–11.0)

## 2013-11-10 LAB — MAGNESIUM: Magnesium: 1.7 mg/dL — ABNORMAL LOW

## 2013-11-17 LAB — CBC CANCER CENTER
BASOS PCT: 0.7 %
Basophil #: 0.1 x10 3/mm (ref 0.0–0.1)
Eosinophil #: 0.1 x10 3/mm (ref 0.0–0.7)
Eosinophil %: 0.6 %
HCT: 31.4 % — ABNORMAL LOW (ref 35.0–47.0)
HGB: 10.7 g/dL — ABNORMAL LOW (ref 12.0–16.0)
LYMPHS ABS: 0.6 x10 3/mm — AB (ref 1.0–3.6)
Lymphocyte %: 5.7 %
MCH: 29.3 pg (ref 26.0–34.0)
MCHC: 34.1 g/dL (ref 32.0–36.0)
MCV: 86 fL (ref 80–100)
MONO ABS: 0.7 x10 3/mm (ref 0.2–0.9)
Monocyte %: 7.1 %
NEUTROS ABS: 8.5 x10 3/mm — AB (ref 1.4–6.5)
NEUTROS PCT: 85.9 %
Platelet: 192 x10 3/mm (ref 150–440)
RBC: 3.66 10*6/uL — ABNORMAL LOW (ref 3.80–5.20)
RDW: 16.3 % — AB (ref 11.5–14.5)
WBC: 9.9 x10 3/mm (ref 3.6–11.0)

## 2013-11-17 LAB — COMPREHENSIVE METABOLIC PANEL
ALK PHOS: 70 U/L
AST: 12 U/L — AB (ref 15–37)
Albumin: 3.3 g/dL — ABNORMAL LOW (ref 3.4–5.0)
Anion Gap: 10 (ref 7–16)
BUN: 12 mg/dL (ref 7–18)
Bilirubin,Total: 0.3 mg/dL (ref 0.2–1.0)
CALCIUM: 8.7 mg/dL (ref 8.5–10.1)
CREATININE: 0.82 mg/dL (ref 0.60–1.30)
Chloride: 92 mmol/L — ABNORMAL LOW (ref 98–107)
Co2: 26 mmol/L (ref 21–32)
EGFR (Non-African Amer.): >60
Glucose: 102 mg/dL — ABNORMAL HIGH (ref 65–99)
Osmolality: 257 (ref 275–301)
Potassium: 4.3 mmol/L (ref 3.5–5.1)
SGPT (ALT): 18 U/L (ref 12–78)
Sodium: 128 mmol/L — ABNORMAL LOW (ref 136–145)
TOTAL PROTEIN: 6.5 g/dL (ref 6.4–8.2)

## 2013-11-29 ENCOUNTER — Ambulatory Visit: Payer: Self-pay | Admitting: Oncology

## 2013-12-01 LAB — CBC CANCER CENTER
Basophil #: 0 x10 3/mm (ref 0.0–0.1)
Basophil %: 0.2 %
EOS ABS: 0 x10 3/mm (ref 0.0–0.7)
EOS PCT: 0.2 %
HCT: 32.6 % — AB (ref 35.0–47.0)
HGB: 11.1 g/dL — ABNORMAL LOW (ref 12.0–16.0)
LYMPHS ABS: 0.3 x10 3/mm — AB (ref 1.0–3.6)
LYMPHS PCT: 6.7 %
MCH: 30 pg (ref 26.0–34.0)
MCHC: 33.9 g/dL (ref 32.0–36.0)
MCV: 88 fL (ref 80–100)
MONOS PCT: 4.3 %
Monocyte #: 0.2 x10 3/mm (ref 0.2–0.9)
NEUTROS PCT: 88.6 %
Neutrophil #: 3.6 x10 3/mm (ref 1.4–6.5)
PLATELETS: 188 x10 3/mm (ref 150–440)
RBC: 3.7 10*6/uL — AB (ref 3.80–5.20)
RDW: 18.9 % — AB (ref 11.5–14.5)
WBC: 4 x10 3/mm (ref 3.6–11.0)

## 2013-12-01 LAB — COMPREHENSIVE METABOLIC PANEL
ALK PHOS: 62 U/L
AST: 16 U/L (ref 15–37)
Albumin: 3.4 g/dL (ref 3.4–5.0)
Anion Gap: 8 (ref 7–16)
BUN: 20 mg/dL — ABNORMAL HIGH (ref 7–18)
Bilirubin,Total: 0.3 mg/dL (ref 0.2–1.0)
CHLORIDE: 91 mmol/L — AB (ref 98–107)
CO2: 30 mmol/L (ref 21–32)
CREATININE: 1.1 mg/dL (ref 0.60–1.30)
Calcium, Total: 8.7 mg/dL (ref 8.5–10.1)
EGFR (African American): >60
GFR CALC NON AF AMER: 57 — AB
GLUCOSE: 112 mg/dL — AB (ref 65–99)
Osmolality: 262 (ref 275–301)
Potassium: 4.3 mmol/L (ref 3.5–5.1)
SGPT (ALT): 24 U/L
Sodium: 129 mmol/L — ABNORMAL LOW (ref 136–145)
Total Protein: 6.4 g/dL (ref 6.4–8.2)

## 2013-12-26 ENCOUNTER — Emergency Department: Payer: Self-pay | Admitting: Emergency Medicine

## 2013-12-30 ENCOUNTER — Ambulatory Visit: Payer: Self-pay | Admitting: Oncology

## 2014-01-02 LAB — COMPREHENSIVE METABOLIC PANEL
ALBUMIN: 3.3 g/dL — AB (ref 3.4–5.0)
ALT: 31 U/L
Alkaline Phosphatase: 91 U/L
Anion Gap: 11 (ref 7–16)
BILIRUBIN TOTAL: 0.6 mg/dL (ref 0.2–1.0)
BUN: 47 mg/dL — ABNORMAL HIGH (ref 7–18)
CALCIUM: 9.6 mg/dL (ref 8.5–10.1)
CO2: 29 mmol/L (ref 21–32)
CREATININE: 1.5 mg/dL — AB (ref 0.60–1.30)
Chloride: 94 mmol/L — ABNORMAL LOW (ref 98–107)
EGFR (African American): 46 — ABNORMAL LOW
EGFR (Non-African Amer.): 39 — ABNORMAL LOW
Glucose: 94 mg/dL (ref 65–99)
Osmolality: 280 (ref 275–301)
POTASSIUM: 4 mmol/L (ref 3.5–5.1)
SGOT(AST): 63 U/L — ABNORMAL HIGH (ref 15–37)
SODIUM: 134 mmol/L — AB (ref 136–145)
TOTAL PROTEIN: 7.4 g/dL (ref 6.4–8.2)

## 2014-01-02 LAB — CBC CANCER CENTER
BASOS ABS: 0.1 x10 3/mm (ref 0.0–0.1)
Basophil %: 0.6 %
Eosinophil #: 0.2 x10 3/mm (ref 0.0–0.7)
Eosinophil %: 1.9 %
HCT: 39.5 % (ref 35.0–47.0)
HGB: 13.4 g/dL (ref 12.0–16.0)
Lymphocyte #: 0.9 x10 3/mm — ABNORMAL LOW (ref 1.0–3.6)
Lymphocyte %: 7.9 %
MCH: 29.2 pg (ref 26.0–34.0)
MCHC: 33.8 g/dL (ref 32.0–36.0)
MCV: 86 fL (ref 80–100)
MONOS PCT: 7.9 %
Monocyte #: 0.9 x10 3/mm (ref 0.2–0.9)
NEUTROS ABS: 9.5 x10 3/mm — AB (ref 1.4–6.5)
NEUTROS PCT: 81.7 %
PLATELETS: 445 x10 3/mm — AB (ref 150–440)
RBC: 4.58 10*6/uL (ref 3.80–5.20)
RDW: 17.3 % — AB (ref 11.5–14.5)
WBC: 11.7 x10 3/mm — ABNORMAL HIGH (ref 3.6–11.0)

## 2014-01-02 LAB — MAGNESIUM: MAGNESIUM: 1.7 mg/dL — AB

## 2014-01-16 LAB — CBC CANCER CENTER
BASOS ABS: 0.1 x10 3/mm (ref 0.0–0.1)
Basophil %: 0.7 %
EOS PCT: 2.6 %
Eosinophil #: 0.3 x10 3/mm (ref 0.0–0.7)
HCT: 36.4 % (ref 35.0–47.0)
HGB: 12.4 g/dL (ref 12.0–16.0)
Lymphocyte #: 1.1 x10 3/mm (ref 1.0–3.6)
Lymphocyte %: 11.3 %
MCH: 29.7 pg (ref 26.0–34.0)
MCHC: 34.2 g/dL (ref 32.0–36.0)
MCV: 87 fL (ref 80–100)
MONO ABS: 0.7 x10 3/mm (ref 0.2–0.9)
MONOS PCT: 6.9 %
Neutrophil #: 7.7 x10 3/mm — ABNORMAL HIGH (ref 1.4–6.5)
Neutrophil %: 78.5 %
Platelet: 305 x10 3/mm (ref 150–440)
RBC: 4.19 10*6/uL (ref 3.80–5.20)
RDW: 17.5 % — ABNORMAL HIGH (ref 11.5–14.5)
WBC: 9.8 x10 3/mm (ref 3.6–11.0)

## 2014-01-16 LAB — BASIC METABOLIC PANEL
ANION GAP: 14 (ref 7–16)
BUN: 7 mg/dL (ref 7–18)
CALCIUM: 8.9 mg/dL (ref 8.5–10.1)
CHLORIDE: 94 mmol/L — AB (ref 98–107)
CO2: 23 mmol/L (ref 21–32)
Creatinine: 0.99 mg/dL (ref 0.60–1.30)
EGFR (African American): >60
EGFR (Non-African Amer.): >60
Glucose: 65 mg/dL (ref 65–99)
Osmolality: 259 (ref 275–301)
Potassium: 4 mmol/L (ref 3.5–5.1)
SODIUM: 131 mmol/L — AB (ref 136–145)

## 2014-01-28 ENCOUNTER — Emergency Department: Payer: Self-pay | Admitting: Emergency Medicine

## 2014-01-28 LAB — COMPREHENSIVE METABOLIC PANEL
ALBUMIN: 3.6 g/dL (ref 3.4–5.0)
ALT: 24 U/L
ANION GAP: 15 (ref 7–16)
Alkaline Phosphatase: 159 U/L — ABNORMAL HIGH
BUN: 8 mg/dL (ref 7–18)
Bilirubin,Total: 0.8 mg/dL (ref 0.2–1.0)
CALCIUM: 8.9 mg/dL (ref 8.5–10.1)
CHLORIDE: 90 mmol/L — AB (ref 98–107)
CREATININE: 0.9 mg/dL (ref 0.60–1.30)
Co2: 22 mmol/L (ref 21–32)
EGFR (African American): >60
EGFR (Non-African Amer.): >60
Glucose: 119 mg/dL — ABNORMAL HIGH (ref 65–99)
OSMOLALITY: 255 (ref 275–301)
Potassium: 3.3 mmol/L — ABNORMAL LOW (ref 3.5–5.1)
SGOT(AST): 28 U/L (ref 15–37)
Sodium: 127 mmol/L — ABNORMAL LOW (ref 136–145)
TOTAL PROTEIN: 7.5 g/dL (ref 6.4–8.2)

## 2014-01-28 LAB — URINALYSIS, COMPLETE
BLOOD: NEGATIVE
Bacteria: NONE SEEN
Bilirubin,UR: NEGATIVE
GLUCOSE, UR: NEGATIVE mg/dL (ref 0–75)
NITRITE: NEGATIVE
PROTEIN: NEGATIVE
Ph: 7 (ref 4.5–8.0)
RBC,UR: 1 /HPF (ref 0–5)
Specific Gravity: 1.011 (ref 1.003–1.030)
Squamous Epithelial: <1
WBC UR: 61 /HPF (ref 0–5)

## 2014-01-28 LAB — LIPASE, BLOOD: Lipase: 104 U/L (ref 73–393)

## 2014-01-28 LAB — CBC WITH DIFFERENTIAL/PLATELET
Basophil #: 0 10*3/uL (ref 0.0–0.1)
Basophil %: 0.4 %
EOS PCT: 0 %
Eosinophil #: 0 10*3/uL (ref 0.0–0.7)
HCT: 37.5 % (ref 35.0–47.0)
HGB: 12.7 g/dL (ref 12.0–16.0)
LYMPHS PCT: 6.7 %
Lymphocyte #: 0.4 10*3/uL — ABNORMAL LOW (ref 1.0–3.6)
MCH: 29.5 pg (ref 26.0–34.0)
MCHC: 33.8 g/dL (ref 32.0–36.0)
MCV: 87 fL (ref 80–100)
MONOS PCT: 4 %
Monocyte #: 0.3 x10 3/mm (ref 0.2–0.9)
Neutrophil #: 6 10*3/uL (ref 1.4–6.5)
Neutrophil %: 88.9 %
PLATELETS: 262 10*3/uL (ref 150–440)
RBC: 4.29 10*6/uL (ref 3.80–5.20)
RDW: 16.5 % — ABNORMAL HIGH (ref 11.5–14.5)
WBC: 6.7 10*3/uL (ref 3.6–11.0)

## 2014-01-28 LAB — TROPONIN I

## 2014-01-29 ENCOUNTER — Ambulatory Visit: Payer: Self-pay | Admitting: Oncology

## 2014-01-29 ENCOUNTER — Emergency Department: Payer: Self-pay | Admitting: Emergency Medicine

## 2014-01-29 LAB — CBC
HCT: 36.4 % (ref 35.0–47.0)
HGB: 11.8 g/dL — AB (ref 12.0–16.0)
MCH: 28.7 pg (ref 26.0–34.0)
MCHC: 32.4 g/dL (ref 32.0–36.0)
MCV: 89 fL (ref 80–100)
Platelet: 248 10*3/uL (ref 150–440)
RBC: 4.11 10*6/uL (ref 3.80–5.20)
RDW: 16.3 % — AB (ref 11.5–14.5)
WBC: 12 10*3/uL — AB (ref 3.6–11.0)

## 2014-01-29 LAB — COMPREHENSIVE METABOLIC PANEL
ALK PHOS: 130 U/L — AB
Albumin: 3.3 g/dL — ABNORMAL LOW (ref 3.4–5.0)
Anion Gap: 13 (ref 7–16)
BUN: 6 mg/dL — AB (ref 7–18)
Bilirubin,Total: 0.9 mg/dL (ref 0.2–1.0)
CHLORIDE: 96 mmol/L — AB (ref 98–107)
CREATININE: 0.93 mg/dL (ref 0.60–1.30)
Calcium, Total: 8.8 mg/dL (ref 8.5–10.1)
Co2: 25 mmol/L (ref 21–32)
EGFR (African American): >60
EGFR (Non-African Amer.): >60
GLUCOSE: 140 mg/dL — AB (ref 65–99)
OSMOLALITY: 268 (ref 275–301)
Potassium: 3.2 mmol/L — ABNORMAL LOW (ref 3.5–5.1)
SGOT(AST): 26 U/L (ref 15–37)
SGPT (ALT): 20 U/L
Sodium: 134 mmol/L — ABNORMAL LOW (ref 136–145)
TOTAL PROTEIN: 6.9 g/dL (ref 6.4–8.2)

## 2014-01-29 LAB — LIPASE, BLOOD: LIPASE: 107 U/L (ref 73–393)

## 2014-02-02 ENCOUNTER — Emergency Department: Payer: Self-pay | Admitting: Psychiatry

## 2014-02-02 LAB — CBC WITH DIFFERENTIAL/PLATELET
BASOS ABS: 0 10*3/uL (ref 0.0–0.1)
BASOS PCT: 0.2 %
Eosinophil #: 0 10*3/uL (ref 0.0–0.7)
Eosinophil %: 0 %
HCT: 36.6 % (ref 35.0–47.0)
HGB: 11.9 g/dL — ABNORMAL LOW (ref 12.0–16.0)
Lymphocyte #: 0.5 10*3/uL — ABNORMAL LOW (ref 1.0–3.6)
Lymphocyte %: 4 %
MCH: 29.1 pg (ref 26.0–34.0)
MCHC: 32.5 g/dL (ref 32.0–36.0)
MCV: 89 fL (ref 80–100)
Monocyte #: 0.3 x10 3/mm (ref 0.2–0.9)
Monocyte %: 2.8 %
Neutrophil #: 11.2 10*3/uL — ABNORMAL HIGH (ref 1.4–6.5)
Neutrophil %: 93 %
PLATELETS: 218 10*3/uL (ref 150–440)
RBC: 4.09 10*6/uL (ref 3.80–5.20)
RDW: 15.6 % — AB (ref 11.5–14.5)
WBC: 12 10*3/uL — ABNORMAL HIGH (ref 3.6–11.0)

## 2014-02-02 LAB — URINALYSIS, COMPLETE
Bacteria: NONE SEEN
Bilirubin,UR: NEGATIVE
Blood: NEGATIVE
Glucose,UR: 50 mg/dL (ref 0–75)
LEUKOCYTE ESTERASE: NEGATIVE
NITRITE: NEGATIVE
Ph: 7 (ref 4.5–8.0)
Protein: NEGATIVE
RBC,UR: 1 /HPF (ref 0–5)
Specific Gravity: 1.01 (ref 1.003–1.030)
WBC UR: 1 /HPF (ref 0–5)

## 2014-02-02 LAB — COMPREHENSIVE METABOLIC PANEL
ALK PHOS: 104 U/L
AST: 18 U/L (ref 15–37)
Albumin: 3.4 g/dL (ref 3.4–5.0)
Anion Gap: 10 (ref 7–16)
BUN: 13 mg/dL (ref 7–18)
Bilirubin,Total: 0.5 mg/dL (ref 0.2–1.0)
CALCIUM: 9 mg/dL (ref 8.5–10.1)
Chloride: 99 mmol/L (ref 98–107)
Co2: 27 mmol/L (ref 21–32)
Creatinine: 0.93 mg/dL (ref 0.60–1.30)
EGFR (African American): >60
EGFR (Non-African Amer.): >60
Glucose: 138 mg/dL — ABNORMAL HIGH (ref 65–99)
Osmolality: 274 (ref 275–301)
Potassium: 3.3 mmol/L — ABNORMAL LOW (ref 3.5–5.1)
SGPT (ALT): 21 U/L
Sodium: 136 mmol/L (ref 136–145)
TOTAL PROTEIN: 6.9 g/dL (ref 6.4–8.2)

## 2014-02-02 LAB — DRUG SCREEN, URINE
Amphetamines, Ur Screen: NEGATIVE (ref ?–1000)
Barbiturates, Ur Screen: NEGATIVE (ref ?–200)
Benzodiazepine, Ur Scrn: POSITIVE (ref ?–200)
CANNABINOID 50 NG, UR ~~LOC~~: POSITIVE (ref ?–50)
COCAINE METABOLITE, UR ~~LOC~~: NEGATIVE (ref ?–300)
MDMA (Ecstasy)Ur Screen: NEGATIVE (ref ?–500)
METHADONE, UR SCREEN: NEGATIVE (ref ?–300)
OPIATE, UR SCREEN: NEGATIVE (ref ?–300)
Phencyclidine (PCP) Ur S: NEGATIVE (ref ?–25)
Tricyclic, Ur Screen: NEGATIVE (ref ?–1000)

## 2014-02-02 LAB — LIPASE, BLOOD: Lipase: 88 U/L (ref 73–393)

## 2014-02-02 LAB — ETHANOL

## 2014-02-02 LAB — TROPONIN I

## 2014-02-08 ENCOUNTER — Emergency Department: Payer: Self-pay | Admitting: Emergency Medicine

## 2014-02-08 LAB — COMPREHENSIVE METABOLIC PANEL
ANION GAP: 9 (ref 7–16)
Albumin: 3.5 g/dL (ref 3.4–5.0)
Alkaline Phosphatase: 92 U/L
BILIRUBIN TOTAL: 0.4 mg/dL (ref 0.2–1.0)
BUN: 17 mg/dL (ref 7–18)
CHLORIDE: 98 mmol/L (ref 98–107)
CO2: 29 mmol/L (ref 21–32)
CREATININE: 0.88 mg/dL (ref 0.60–1.30)
Calcium, Total: 9.4 mg/dL (ref 8.5–10.1)
EGFR (African American): >60
Glucose: 131 mg/dL — ABNORMAL HIGH (ref 65–99)
Osmolality: 275 (ref 275–301)
POTASSIUM: 3.3 mmol/L — AB (ref 3.5–5.1)
SGOT(AST): 31 U/L (ref 15–37)
SGPT (ALT): 27 U/L
SODIUM: 136 mmol/L (ref 136–145)
TOTAL PROTEIN: 7.4 g/dL (ref 6.4–8.2)

## 2014-02-08 LAB — CBC
HCT: 39 % (ref 35.0–47.0)
HGB: 13.1 g/dL (ref 12.0–16.0)
MCH: 29.9 pg (ref 26.0–34.0)
MCHC: 33.6 g/dL (ref 32.0–36.0)
MCV: 89 fL (ref 80–100)
Platelet: 292 10*3/uL (ref 150–440)
RBC: 4.38 10*6/uL (ref 3.80–5.20)
RDW: 14.7 % — ABNORMAL HIGH (ref 11.5–14.5)
WBC: 9.5 10*3/uL (ref 3.6–11.0)

## 2014-02-08 LAB — LIPASE, BLOOD: Lipase: 134 U/L (ref 73–393)

## 2014-02-16 LAB — BASIC METABOLIC PANEL
Anion Gap: 10 (ref 7–16)
BUN: 11 mg/dL (ref 7–18)
CALCIUM: 9.6 mg/dL (ref 8.5–10.1)
Chloride: 98 mmol/L (ref 98–107)
Co2: 29 mmol/L (ref 21–32)
Creatinine: 1 mg/dL (ref 0.60–1.30)
EGFR (African American): >60
EGFR (Non-African Amer.): >60
Glucose: 136 mg/dL — ABNORMAL HIGH (ref 65–99)
Osmolality: 275 (ref 275–301)
Potassium: 3.9 mmol/L (ref 3.5–5.1)
SODIUM: 137 mmol/L (ref 136–145)

## 2014-02-17 ENCOUNTER — Emergency Department: Payer: Self-pay | Admitting: Emergency Medicine

## 2014-02-17 LAB — COMPREHENSIVE METABOLIC PANEL
ALBUMIN: 3.6 g/dL (ref 3.4–5.0)
ALT: 33 U/L
Alkaline Phosphatase: 99 U/L
Anion Gap: 12 (ref 7–16)
BILIRUBIN TOTAL: 0.4 mg/dL (ref 0.2–1.0)
BUN: 16 mg/dL (ref 7–18)
CALCIUM: 9.5 mg/dL (ref 8.5–10.1)
CREATININE: 0.96 mg/dL (ref 0.60–1.30)
Chloride: 98 mmol/L (ref 98–107)
Co2: 26 mmol/L (ref 21–32)
EGFR (African American): >60
EGFR (Non-African Amer.): >60
Glucose: 160 mg/dL — ABNORMAL HIGH (ref 65–99)
Osmolality: 277 (ref 275–301)
Potassium: 3.6 mmol/L (ref 3.5–5.1)
SGOT(AST): 31 U/L (ref 15–37)
Sodium: 136 mmol/L (ref 136–145)
TOTAL PROTEIN: 7.7 g/dL (ref 6.4–8.2)

## 2014-02-17 LAB — CBC
HCT: 41 % (ref 35.0–47.0)
HGB: 13.3 g/dL (ref 12.0–16.0)
MCH: 29.1 pg (ref 26.0–34.0)
MCHC: 32.4 g/dL (ref 32.0–36.0)
MCV: 90 fL (ref 80–100)
PLATELETS: 373 10*3/uL (ref 150–440)
RBC: 4.55 10*6/uL (ref 3.80–5.20)
RDW: 14.2 % (ref 11.5–14.5)
WBC: 11.3 10*3/uL — ABNORMAL HIGH (ref 3.6–11.0)

## 2014-02-17 LAB — TROPONIN I: Troponin-I: <0.02 ng/mL

## 2014-02-17 LAB — LIPASE, BLOOD: LIPASE: 128 U/L (ref 73–393)

## 2014-02-18 LAB — COMPREHENSIVE METABOLIC PANEL
AST: 29 U/L (ref 15–37)
Albumin: 3.6 g/dL (ref 3.4–5.0)
Alkaline Phosphatase: 90 U/L
Anion Gap: 10 (ref 7–16)
BUN: 18 mg/dL (ref 7–18)
Bilirubin,Total: 0.4 mg/dL (ref 0.2–1.0)
CO2: 28 mmol/L (ref 21–32)
Calcium, Total: 9.3 mg/dL (ref 8.5–10.1)
Chloride: 99 mmol/L (ref 98–107)
Creatinine: 0.91 mg/dL (ref 0.60–1.30)
Glucose: 139 mg/dL — ABNORMAL HIGH (ref 65–99)
OSMOLALITY: 278 (ref 275–301)
Potassium: 3.7 mmol/L (ref 3.5–5.1)
SGPT (ALT): 31 U/L
Sodium: 137 mmol/L (ref 136–145)
Total Protein: 7.7 g/dL (ref 6.4–8.2)

## 2014-02-18 LAB — CBC WITH DIFFERENTIAL/PLATELET
BASOS ABS: 0.3 10*3/uL — AB (ref 0.0–0.1)
BASOS PCT: 1.8 %
Eosinophil #: 0.1 10*3/uL (ref 0.0–0.7)
Eosinophil %: 0.6 %
HCT: 39.3 % (ref 35.0–47.0)
HGB: 13 g/dL (ref 12.0–16.0)
Lymphocyte #: 0.9 10*3/uL — ABNORMAL LOW (ref 1.0–3.6)
Lymphocyte %: 6 %
MCH: 29.7 pg (ref 26.0–34.0)
MCHC: 33.1 g/dL (ref 32.0–36.0)
MCV: 90 fL (ref 80–100)
Monocyte #: 0.6 x10 3/mm (ref 0.2–0.9)
Monocyte %: 4.1 %
Neutrophil #: 13.3 10*3/uL — ABNORMAL HIGH (ref 1.4–6.5)
Neutrophil %: 87.5 %
Platelet: 345 10*3/uL (ref 150–440)
RBC: 4.38 10*6/uL (ref 3.80–5.20)
RDW: 14.8 % — ABNORMAL HIGH (ref 11.5–14.5)
WBC: 15.2 10*3/uL — AB (ref 3.6–11.0)

## 2014-02-18 LAB — LIPASE, BLOOD: Lipase: 81 U/L (ref 73–393)

## 2014-02-19 ENCOUNTER — Emergency Department: Payer: Self-pay | Admitting: Emergency Medicine

## 2014-02-24 ENCOUNTER — Emergency Department: Payer: Self-pay | Admitting: Emergency Medicine

## 2014-02-24 LAB — CBC WITH DIFFERENTIAL/PLATELET
BASOS ABS: 0 10*3/uL (ref 0.0–0.1)
Basophil %: 0.2 %
Eosinophil #: 0 10*3/uL (ref 0.0–0.7)
Eosinophil %: 0 %
HCT: 41.1 % (ref 35.0–47.0)
HGB: 13.3 g/dL (ref 12.0–16.0)
LYMPHS ABS: 0.5 10*3/uL — AB (ref 1.0–3.6)
Lymphocyte %: 3.6 %
MCH: 28.9 pg (ref 26.0–34.0)
MCHC: 32.3 g/dL (ref 32.0–36.0)
MCV: 90 fL (ref 80–100)
MONOS PCT: 1.8 %
Monocyte #: 0.2 x10 3/mm (ref 0.2–0.9)
Neutrophil #: 12.2 10*3/uL — ABNORMAL HIGH (ref 1.4–6.5)
Neutrophil %: 94.4 %
Platelet: 302 10*3/uL (ref 150–440)
RBC: 4.59 10*6/uL (ref 3.80–5.20)
RDW: 14.1 % (ref 11.5–14.5)
WBC: 13 10*3/uL — ABNORMAL HIGH (ref 3.6–11.0)

## 2014-02-24 LAB — ETHANOL: Ethanol: <3 mg/dL

## 2014-02-24 LAB — COMPREHENSIVE METABOLIC PANEL
ALBUMIN: 3.7 g/dL (ref 3.4–5.0)
ALK PHOS: 95 U/L
ALT: 41 U/L
Anion Gap: 15 (ref 7–16)
BILIRUBIN TOTAL: 0.5 mg/dL (ref 0.2–1.0)
BUN: 17 mg/dL (ref 7–18)
CALCIUM: 9.4 mg/dL (ref 8.5–10.1)
CREATININE: 1.15 mg/dL (ref 0.60–1.30)
Chloride: 98 mmol/L (ref 98–107)
Co2: 27 mmol/L (ref 21–32)
GFR CALC NON AF AMER: 52 — AB
Glucose: 171 mg/dL — ABNORMAL HIGH (ref 65–99)
OSMOLALITY: 285 (ref 275–301)
POTASSIUM: 3.5 mmol/L (ref 3.5–5.1)
SGOT(AST): 34 U/L (ref 15–37)
Sodium: 140 mmol/L (ref 136–145)
TOTAL PROTEIN: 7.8 g/dL (ref 6.4–8.2)

## 2014-02-24 LAB — SALICYLATE LEVEL: Salicylates, Serum: 2.1 mg/dL

## 2014-02-24 LAB — TROPONIN I

## 2014-02-24 LAB — ACETAMINOPHEN LEVEL: Acetaminophen: <2 ug/mL

## 2014-02-24 LAB — LIPASE, BLOOD: Lipase: 54 U/L — ABNORMAL LOW (ref 73–393)

## 2014-02-25 LAB — URINALYSIS, COMPLETE
BILIRUBIN, UR: NEGATIVE
BLOOD: NEGATIVE
Bacteria: NONE SEEN
Hyaline Cast: 6
Leukocyte Esterase: NEGATIVE
Nitrite: NEGATIVE
PH: 6 (ref 4.5–8.0)
Specific Gravity: 1.02 (ref 1.003–1.030)
Squamous Epithelial: 1

## 2014-02-25 LAB — DRUG SCREEN, URINE
Amphetamines, Ur Screen: NEGATIVE (ref ?–1000)
BARBITURATES, UR SCREEN: POSITIVE (ref ?–200)
Benzodiazepine, Ur Scrn: POSITIVE (ref ?–200)
CANNABINOID 50 NG, UR ~~LOC~~: POSITIVE (ref ?–50)
COCAINE METABOLITE, UR ~~LOC~~: NEGATIVE (ref ?–300)
MDMA (Ecstasy)Ur Screen: NEGATIVE (ref ?–500)
Methadone, Ur Screen: NEGATIVE (ref ?–300)
OPIATE, UR SCREEN: NEGATIVE (ref ?–300)
PHENCYCLIDINE (PCP) UR S: NEGATIVE (ref ?–25)
Tricyclic, Ur Screen: NEGATIVE (ref ?–1000)

## 2014-03-01 ENCOUNTER — Ambulatory Visit: Payer: Self-pay | Admitting: Oncology

## 2014-03-06 ENCOUNTER — Emergency Department: Payer: Self-pay | Admitting: Emergency Medicine

## 2014-03-06 LAB — LIPASE, BLOOD: Lipase: 58 U/L — ABNORMAL LOW (ref 73–393)

## 2014-03-06 LAB — CBC
HCT: 41.5 % (ref 35.0–47.0)
HGB: 13.9 g/dL (ref 12.0–16.0)
MCH: 29.5 pg (ref 26.0–34.0)
MCHC: 33.5 g/dL (ref 32.0–36.0)
MCV: 88 fL (ref 80–100)
Platelet: 304 10*3/uL (ref 150–440)
RBC: 4.72 10*6/uL (ref 3.80–5.20)
RDW: 14.1 % (ref 11.5–14.5)
WBC: 18.1 10*3/uL — ABNORMAL HIGH (ref 3.6–11.0)

## 2014-03-06 LAB — COMPREHENSIVE METABOLIC PANEL
ALBUMIN: 3.6 g/dL (ref 3.4–5.0)
ALK PHOS: 92 U/L
ALT: 36 U/L
AST: 35 U/L (ref 15–37)
Anion Gap: 13 (ref 7–16)
BUN: 16 mg/dL (ref 7–18)
Bilirubin,Total: 0.7 mg/dL (ref 0.2–1.0)
CHLORIDE: 93 mmol/L — AB (ref 98–107)
CO2: 29 mmol/L (ref 21–32)
CREATININE: 0.86 mg/dL (ref 0.60–1.30)
Calcium, Total: 9.1 mg/dL (ref 8.5–10.1)
EGFR (African American): >60
GLUCOSE: 158 mg/dL — AB (ref 65–99)
Osmolality: 275 (ref 275–301)
Potassium: 3.2 mmol/L — ABNORMAL LOW (ref 3.5–5.1)
SODIUM: 135 mmol/L — AB (ref 136–145)
Total Protein: 7.5 g/dL (ref 6.4–8.2)

## 2014-03-22 ENCOUNTER — Emergency Department: Payer: Self-pay | Admitting: Emergency Medicine

## 2014-03-22 LAB — COMPREHENSIVE METABOLIC PANEL
ALK PHOS: 92 U/L
ALT: 27 U/L
Albumin: 3.5 g/dL (ref 3.4–5.0)
Anion Gap: 15 (ref 7–16)
BUN: 13 mg/dL (ref 7–18)
Bilirubin,Total: 0.7 mg/dL (ref 0.2–1.0)
CO2: 26 mmol/L (ref 21–32)
Calcium, Total: 9.3 mg/dL (ref 8.5–10.1)
Chloride: 87 mmol/L — ABNORMAL LOW (ref 98–107)
Creatinine: 0.99 mg/dL (ref 0.60–1.30)
EGFR (African American): >60
Glucose: 152 mg/dL — ABNORMAL HIGH (ref 65–99)
OSMOLALITY: 260 (ref 275–301)
POTASSIUM: 2.9 mmol/L — AB (ref 3.5–5.1)
SGOT(AST): 26 U/L (ref 15–37)
SODIUM: 128 mmol/L — AB (ref 136–145)
Total Protein: 7.2 g/dL (ref 6.4–8.2)

## 2014-03-22 LAB — CBC WITH DIFFERENTIAL/PLATELET
BASOS PCT: 0.1 %
Basophil #: 0 10*3/uL (ref 0.0–0.1)
EOS PCT: 0 %
Eosinophil #: 0 10*3/uL (ref 0.0–0.7)
HCT: 41.8 % (ref 35.0–47.0)
HGB: 14.2 g/dL (ref 12.0–16.0)
Lymphocyte #: 0.8 10*3/uL — ABNORMAL LOW (ref 1.0–3.6)
Lymphocyte %: 6 %
MCH: 29.2 pg (ref 26.0–34.0)
MCHC: 33.9 g/dL (ref 32.0–36.0)
MCV: 86 fL (ref 80–100)
Monocyte #: 0.6 x10 3/mm (ref 0.2–0.9)
Monocyte %: 4.6 %
NEUTROS ABS: 12.1 10*3/uL — AB (ref 1.4–6.5)
NEUTROS PCT: 89.3 %
PLATELETS: 342 10*3/uL (ref 150–440)
RBC: 4.85 10*6/uL (ref 3.80–5.20)
RDW: 13.6 % (ref 11.5–14.5)
WBC: 13.6 10*3/uL — ABNORMAL HIGH (ref 3.6–11.0)

## 2014-03-22 LAB — LIPASE, BLOOD: LIPASE: 94 U/L (ref 73–393)

## 2014-03-22 LAB — TROPONIN I: Troponin-I: 0.02 ng/mL

## 2014-08-22 NOTE — H&P (Signed)
PATIENT NAME:  Deborah Watson, Deborah Watson MR#:  [id number] DATE OF BIRTH:  1961/01/07  DATE OF ADMISSION:  07/01/2013  PRIMARY CARE PHYSICIAN:  Unknown.  She states that she does not have one.   CHIEF COMPLAINT: Abdominal pain.   HISTORY OF PRESENT ILLNESS: This is a 54 year old female with anxiety, depression and recently found to have a right neck mass. She has been on and off antibiotics recently by Dr. Willeen Cass, ENT.  She presents with nausea, vomiting too many times to count; abdominal pain, severe. She received pain medications and Haldol in the ER and was unable to give much of a history. She is unable to describe the pain except that it is severe. No radiation. Nothing makes it better or worse. In the ER, a lipase was found to be greater than 10,000 and hospitalist services were contacted for further evaluation. On further questioning, the patient states that she did have a history of pancreatitis in the past and did have history of alcohol in the past. She quit drinking on January 25th of this year. She used to drink 3 to 5 cans of beer per day.   PAST MEDICAL HISTORY: Anxiety, depression, right neck mass, history of pancreatitis in the past, history of alcohol abuse in the past.   PAST SURGICAL HISTORY: Cholecystectomy.   ALLERGIES: No known drug allergies.   MEDICATIONS: Include Augmentin 875/125, 1 tablet twice a day for 10 days; Celexa 20 mg daily, Seroquel 100 mg at bedtime, Zofran ODT 4 mg 3 times a day as needed for nausea and vomiting.   SOCIAL HISTORY: Smokes 1-1/2 packs per day. Quit alcohol, January 25th; did drink 3 to 5 cans of beer per day prior to that. Occasional marijuana abuse. Currently not working.   FAMILY HISTORY: Difficult to obtain. I think her mother died of some lung issue. Father is healthy.      REVIEW OF SYSTEMS:   CONSTITUTIONAL: Positive for freezing cold. No fever or chills. Positive for fatigue. No weight loss. No weight gain.  EYES: No blurry vision.  EARS,  NOSE, MOUTH AND THROAT: Positive for runny nose. No sore throat. No difficulty swallowing.  CARDIOVASCULAR: No chest pain. No palpitations.  RESPIRATORY: No shortness of breath. No cough. No sputum. No hemoptysis.  GASTROINTESTINAL: Positive for nausea, vomiting. Positive for abdominal pain. No diarrhea. No constipation. No bright red blood per rectum. No melena.  GENITOURINARY: No burning on urination or hematuria.  MUSCULOSKELETAL: No joint pain or muscle pain.  INTEGUMENT: No rashes or eruptions.  NEUROLOGIC: No fainting or blackouts.  PSYCHIATRIC: Positive for anxiety and depression.  ENDOCRINE: No thyroid problems.  HEMATOLOGIC AND LYMPHATIC: No anemia.   PHYSICAL EXAMINATION: VITAL SIGNS: Temperature 97.6, pulse 64, respirations 18, blood pressure 141/71, pulse ox 100% on room air.  GENERAL: No respiratory distress.  EYES: Conjunctivae and lids normal. Pupils equal, round and reactive to light. Extraocular muscles intact. No nystagmus.  EARS, NOSE, MOUTH AND THROAT: Tympanic membranes: No erythema. Nasal mucosa: No erythema. Throat: No erythema. No exudate seen. Lips and gums: No lesions.  NECK: No JVD. No bruits. No lymphadenopathy. No thyromegaly. No thyroid nodules palpated.  RESPIRATORY: Lungs clear to auscultation. No use of accessory muscles to breathe. No rhonchi, rales or wheeze heard.  CARDIOVASCULAR: S1, S2 normal. No gallops, rubs or murmurs heard. Carotid upstroke 2+ bilaterally and no bruits. Dorsalis pedis pulses 2+ bilaterally. Trace edema of the lower extremities.  ABDOMEN: Soft. Positive tenderness in the epigastric area. No organomegaly/splenomegaly. Normoactive bowel  sounds. No masses felt.  LYMPHATIC: No lymph nodes in the neck.  MUSCULOSKELETAL: No clubbing, edema or cyanosis.  SKIN: No rashes or ulcers seen.  NEUROLOGIC: Cranial nerves II through XII grossly intact. Deep tendon reflexes 2+ bilateral lower extremities.  PSYCHIATRIC: The patient is alert, oriented  to person and place, but is confused, I think, with  the Haldol that was given early.   EKG: Normal sinus rhythm, 60 beats per minute, flipped T waves laterally.   ASSESSMENT AND PLAN: 1.  Acute pancreatitis with lipase greater than 10,000. We will give IV fluid hydration, keep n.p.o. overnight. IV pain medication, IV nausea medication. I ordered a stat triglyceride level, which came back at 197. With her history of alcohol, I believe that this is the culprit. The patient did have her gallbladder out. ER physician ordered an ultrasound, which showed a distended common bile duct and central intrahepatic ducts postcholecystectomy. Liver function test bilirubin was actually normal. Alkaline phosphatase only slightly elevated at 136, ALT 32 and AST 80.  I will put the patient on CIWA protocol just in case.  2.  Anxiety, depression. Continue Celexa and Seroquel.  3. Right neck mass. Dr. Willeen CassBennett, ENT, is following as outpatient and has the patient on Augmentin. I will switch that over to Unasyn.  4. Tobacco abuse. Smoking cessation counseling done 3 minutes by me. Nicotine patch applied.  5.  Hypomagnesemia. We will replace magnesium.  6.  Chronic kidney disease stage III. I will give IV fluid hydration to see if this helps out.   TIME SPENT ON ADMISSION: 55 minutes.    ____________________________ Herschell Dimesichard J. Renae GlossWieting, MD rjw:dmm D: 07/01/2013 19:53:30 ET T: 07/01/2013 20:15:27 ET JOB#: [id number]  cc: Herschell Dimesichard J. Renae GlossWieting, MD, <Dictator>

## 2014-08-22 NOTE — Op Note (Signed)
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    PROCEDURES:  1.  Ultrasound guidance for vascular access, left internal jugular vein.  2.  Fluoroscopic guidance for placement of catheter.  3.  Placement of CT compatible Port-A-Cath, left internal jugular vein.   SURGEON:  Festus BarrenJason Dew, M.D.  ANESTHESIA:  Local with moderate conscious sedation.   FLUOROSCOPY TIME:  Less than 1 minute.   CONTRAST:  Zero.   ESTIMATED BLOOD LOSS:  25 mL.  INDICATION FOR PROCEDURE:  A 54 year old female with recent radical neck dissection for squamous cell carcinoma of the head and neck. She now needs a Port-A-Cath for chemotherapy and venous access. Risks and benefits were discussed. Informed consent was obtained.   DESCRIPTION OF THE PROCEDURE:  The patient was brought to the vascular and interventional radiology suite. The left neck and chest were sterilely prepped and draped, and a sterile surgical field was created. Ultrasound was used to help visualize a patent left internal jugular vein. This was then accessed under direct ultrasound guidance without difficulty with a Seldinger needle and a permanent image was recorded. A J-wire was placed. After skin nick and dilatation, the peel-away sheath was then placed over the wire. I then anesthetized an area under the clavicle approximately 2 fingerbreadths. A transverse incision was created and an inferior pocket was created with electrocautery and blunt dissection. The port was then brought onto the field, placed into the pocket and secured to the chest wall with 2 Prolene sutures. The catheter was connected to the port and tunneled from the subclavicular incision to the access site.  Fluoroscopic guidance was used to cut the catheter to an appropriate length. The catheter was then placed through the peel-away sheath and the peel-away sheath was removed. The catheter tip was parked in excellent location in the proximal portion of the superior vena cava just past the innominate vein. The pocket was then irrigated with antibiotic-impregnated saline and the wound was closed with a running 3-0 Vicryl and a 4-0 Monocryl. The access incision was closed with a single 4-0 Monocryl. The Huber needle was used to withdraw blood and flush the port with heparinized saline. Dermabond was then placed as a dressing. The patient tolerated the procedure well and was taken to the recovery room in stable condition.    ____________________________ Annice NeedyJason S. Dew, MD jsd:dmm D: 08/21/2013 12:13:50 ET T: 08/21/2013 12:59:04 ET JOB#: [id number]  cc: Annice NeedyJason S. Dew, MD, <Dictator> Annice NeedyJASON S DEW MD ELECTRONICALLY SIGNED 08/25/2013 9:36

## 2014-08-22 NOTE — Consult Note (Signed)
Psychiatry: Consult for this patient who presented to the emergency room with complaints of vomiting.  Information obtained from the chart and conversation with the emergency room physician's.  Also attempted to interact with the patient. by Dr. Mayford KnifeWilliams because of his concern that this patient who presents frequently with intractable vomiting that has defied any specific diagnosis is suffering from severe depression.  It's reported that Dr. Mayford KnifeWilliams has observed the patient crying, reporting severe depression, appearing in extreme emotional distress.  It is unknown what if any psychiatric treatment she is currently getting.  I am unclear as to whether there is any concern about her depression affecting her safety or that of others. interview today the patient was very sleepy and was able to give me only limited history.  She did tell me that she had come into the hospital because of vomiting.  She understood that there was concern about depression but was not able to give me any history or information.  She told me that she lives with someone named Casimiro NeedleMichael.  She told me that she had cancer but all she could tell me was that it had something to do with her throat.  I'm not clear as to whether she is still actively receiving treatment for cancer or not.  On exam this is a patient who looks significantly older than her stated age and appears chronically sick.  Eye contact only intermittent and finally she shut her eyes completely.  Affect ranged from flat to moaning and crying.  Mood was not stated.  Thoughts appeared to be halting and very limited in content.  Patient not answering specific questions about suicidality. the physical exam which is probably at least in part due to medication she has received limits my ability to more completely evaluate her.  We will have to wait until she is more alert and then conduct a reevaluation.  Unclear what options would be available in her physical condition.  We will cross  that bridge when we come to it.  I will not add any other medication at this time. not otherwise specified  Electronic Signatures: Clapacs, Jackquline DenmarkJohn T (MD)  (Signed on 05-Oct-15 17:08)  Authored  Last Updated: 05-Oct-15 17:08 by Audery Amellapacs, John T (MD)

## 2014-08-22 NOTE — Op Note (Signed)
PATIENT NAME:  Deborah GrayFOLLIETT, Katharina K MR#:  [id number] DATE OF BIRTH:  July 23, 1960  DATE OF PROCEDURE:  07/15/2013  PREOPERATIVE DIAGNOSIS: Right cervical lymphadenopathy.   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  PROCEDURES: 1. Right modified radical neck dissection.  2. Right Tonsillectomy.  3. Direct laryngoscopy.   SURGEON: Marion DownerScott Cenia Zaragosa, MD   ANESTHESIA: General endotracheal.   INDICATIONS: The patient with a history of a right neck mass with a necrotic center. Fine needle aspiration was nondiagnostic showing just inflammatory cells and there was no evidence of a primary on CT imaging or laryngoscopy in the office.   FINDINGS: The patient had a matted group of right cervical lymph nodes involving levels 2 and 3 roughly 5 x 6 cm in greatest dimensions which appeared to infiltrate the midportion of the sternocleidomastoid muscle as well as the internal jugular vein. Multiple smaller nodes were found in levels 2 and 3 and 4. Laryngoscopy revealed no obvious primary. The tongue base was palpated and was soft and there were no lesions seen in the larynx or piriform sinus. The right tonsil was slightly firm and was removed to be sent for possible source as a primary.   COMPLICATIONS: None.   DESCRIPTION OF PROCEDURE: After obtaining informed consent, the patient was taken to the Operating Room and placed in the supine position. After induction of general endotracheal anesthesia, the patient was turned 90 degrees. The right neck was injected over the proposed incision line with 1% lidocaine with epinephrine 1:200,000. The neck was then prepped and draped in the usual sterile fashion. A 15 blade was used to incise the skin over the neck mass. The incision was carried down through the platysma with the Bovie. The neck mass was then dissected out and found to be infiltrating the sternocleidomastoid muscle. A cuff of muscle was taken along with the mass during  dissection. The dissection was performed using the Harmonic scalpel predominantly. Dissecting medially around the mass the internal jugular vein was encountered and found to be infiltrated by the mass and could not be separated from it. The vein was divided inferiorly utilizing both a 2-0 stick tie and 2-0 straight tie to secure the vein. Dissection proceeded superiorly around the mass where the superior aspect of the internal jugular vein was dissected out. This was clamped and tied again with both 2-0 stick tie and regular tie. The mass was then dissected away from the remainder of the sternocleidomastoid muscle resecting a portion of the muscle. This involved resection of approximately 50% of the volume of the midportion of the sternocleidomastoid muscle. Surrounding structures involved by the mass likely included the distal aspect of the spinal accessory nerve, which was involved within the mass and had to be sacrificed. The mass was completely dissected out and sent for frozen section. The frozen section confirmed metastatic carcinoma, likely squamous cell carcinoma. The neck was further opened along the sternocleidomastoid muscle and crossing the midline with a 15 blade for completion of a neck dissection. The incision was carried through the platysma muscle and the subplatysmal flap elevated up over the submandibular gland superiorly. Care was taken to avoid injury to the marginal mandibular nerve during dissection over the submandibular gland. The facial vein was clamped and divided and retracted superiorly to help protect the marginal mandibular nerve. The submental fat pad was then resected with the Bovie and dissection proceeded posteriorly back over the mylohyoid muscle. The fat and lymph nodes surrounding the mylohyoid muscle were resected  en bloc with the submandibular gland. It was carefully dissected out and attachments divided with the Harmonic scalpel including the nerve from the nerve branch from  the lingual nerve. The lingual nerve itself was spared. The hypoglossal nerve was identified and spared during the dissection. Branches from the facial artery to the submandibular gland were divided with the Harmonic scalpel, but the artery itself was preserved. The gland and surrounding fat and lymph nodes were then dissected out of the submandibular triangle completing the level one dissection.   Next, the undissected portions of the sternocleidomastoid muscle were dissected, dissecting the fat and associated lymph nodes away from the medial aspect of the sternocleidomastoid muscle from the area just below the digastric muscle down to the clavicle inferiorly. The proximal  aspect of the spinal accessory nerve was identified and spared and the level 2 specimen dissected out from around the spinal accessory nerve. Dissection proceeded inferiorly along the length of the sternocleidomastoid muscle with the deep aspect of the dissection being the anterior border of the trapezius muscle, such that the level 5 lymph nodes were dissected. The specimen was then dissected off of the floor of the neck and dissection proceeded anteriorly. Some of the cervical rootlets were divided during the course of the dissection to facilitate complete removal of a fairly large fat and lymph nodes specimen. The omohyoid muscle was divided with the Harmonic scalpel. Dissection proceeded medially until the lateral border of the remnant of the internal jugular vein was encountered. The specimen was then dissected off the remnant of the internal jugular vein. The dissection proceeded over the strap muscles removing the fatty envelope of tissue over the strap muscles and dividing anterior jugular vein segments as encountered, using either the Harmonic scalpel or in the case of larger veins clamping and tying the veins.   The specimen was then divided into levels 1, 2, 3, 4 and 5 and sent separately. The wound was irrigated and bleeding  controlled. A #10 JP drain was placed through a separate stab incision and secured with a 2-0 silk suture. The wound was then closed with 4-0 Vicryl suture for the platysma and subcutaneous closure followed by staples for the skin. The drain was hooked to bulb suction.   The laryngoscopy was then performed. The patient had very poor dentition but care was taken not to damage the teeth. A direct laryngoscopy was performed carefully inspecting the hypopharynx, larynx and tongue base. No obvious source of a primary was identified. The tongue base was carefully palpated and there was no evidence of palpable mass there. A decision was made to go ahead and remove the right tonsil which felt slightly firm. A Crowe Davis retractor was used to open the mouth and tonsil was grasped with an Allis and resected from the tonsillar fossa in the usual fashion with Bovie. Bleeding was controlled with the Bovie. The throat was irrigated and suctioned to remove any blood clot and the patient was then returned to the anesthesiologist for awakening. She was awakened and taken to the recovery room in good condition postoperatively. Blood loss was approximately 75 mL.   ____________________________ Ollen Gross. Willeen Cass, MD psb:sg D: 07/15/2013 12:14:05 ET T: 07/15/2013 13:20:57 ET JOB#: [id number]  cc: Ollen Gross. Willeen Cass, MD, <Dictator> Sandi Mealy MD ELECTRONICALLY SIGNED 07/23/2013 8:41

## 2014-08-22 NOTE — Consult Note (Signed)
PATIENT NAME:  Deborah Watson, [name] MR#:  [id number] DATE OF BIRTH:  12-28-1960  DATE OF CONSULTATION:  02/04/2014  CONSULTING PHYSICIAN:  Audery AmelJohn T. Clapacs, MD  HISTORY OF PRESENT ILLNESS: See intake yesterday. Since yesterday the patient has rested calmly in the Emergency Room. She has been given a soft diet and has been able to eat and drink a little bit. On re-evaluation today her mood is improved. She denies suicidal ideation. She is able to discuss her problems and concerns in a rational way. She emphasizes her concern about her chronic pain and her inability to eat well. Also has significant social problems. She does have a depressed mood, but is not psychotic and is not acutely suicidal. [facility].   REVIEW OF SYSTEMS: Chronic pain in the mouth and throat. She denies suicidal ideation. Denies hallucinations and no new complaints.   MENTAL STATUS EXAMINATION: Alert and oriented x 4. Good eye contact. Normal speech, although somewhat impaired by her throat problems, a little bit quiet. Affect, smiling, more reactive and appropriate. Mood stated as being better. Thoughts are lucid. No loosening of associations or delusions. Denies auditory or visual hallucinations. Denies suicidal or homicidal ideation. Shows positive plans for the future. Judgment and insight improved. Intelligence normal. Short and long-term memory intact.   ASSESSMENT: The patient's depression on re-evaluation seems to have stabilized. At this point I think she is probably safe for discharge home. She has outpatient treatment in place already. I have suggested that we increase the dose of her antidepressant from 20 mg of citalopram to 40 mg a day which she is completely agreeable to. Side effects discussed. I have had a dietitian come down and meet with her. They seem to have had a very productive conversation about soft diet and ways for the patient to manage her pain in her throat in terms of her diet  while still getting decent nutrition. I discussed the case with the Emergency Room physician. Admission order will be canceled. The patient will be discharged home and will follow up with [facility].   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     ____________________________ Audery AmelJohn T. Clapacs, MD jtc:JT D: 02/04/2014 15:38:00 ET T: 02/04/2014 16:04:03 ET JOB#: [id number]  cc: Audery AmelJohn T. Clapacs, MD, <Dictator> Audery AmelJOHN T CLAPACS MD ELECTRONICALLY SIGNED 02/09/2014 0:21

## 2014-08-22 NOTE — Consult Note (Signed)
Reason for Visit: This 54 year old Female patient presents to the clinic for initial evaluation of  head and neck cancer .   Referred by Dr. Orlie Watson.  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 Underwent right radical neck dissection showing poorly differentiated squamous cell carcinoma and a high level one right cervical node. Levels 2 through 5 were negative. Right tonsillar region was negative. She went on to have a PET CT scan showingprobable primary squamous cell carcinoma in the region of the left tonsillar base of tongue region with associated level IB and level IV metastatic lymph nodes. No evidence to suggest distant disease.patient has a history of probable drug dependency and heavy alcohol and tobacco use. Her teeth are in extremely poor shape. She is seen today from radiation oncology opinion. She is having some pain in her prior right neck dissection scar and has had a Port-A-Cath placed yesterday. She's having no significant dysphasia or head or neck pain at this time except as described from prior surgery.  Past Hx:    Pancreatitis: Mar 2015   Depression:    gallstones:    alcohol abuse:    Anxiety:    Insomnia:    Cholecystectomy:   Past, Family and Social History:  Past Medical History positive   Gastrointestinal cholecystectomy, pancreatitis   Neurological/Psychiatric anxiety; depression; insomnia   Family History noncontributory   Social History positive   Social History Comments significant  greater than 40-pack-year smoking history significant EtOH abuse history and probable drug abuse history   Additional Past Medical and Surgical History accompanied by companion today   Allergies:   Codeine: Headaches  Home Meds:  Home Medications: Medication Instructions Status  hydrOXYzine hydrochloride 25 mg oral tablet 1 tab(s) orally 3 times a day, As Needed Active  CeleXA 20 mg oral tablet 1 tab(s) orally once a day Active  SEROquel 100 mg oral tablet 1 tab(s) orally once a day (at bedtime) Active  Zofran ODT 4 mg oral tablet, disintegrating 1 tab(s) orally 3 times a day, As Needed - for Nausea, Vomiting Active   Review of Systems:  General negative   Performance Status (ECOG) 0   Skin negative   Breast negative   Ophthalmologic negative   ENMT see HPI   Respiratory and Thorax negative   Cardiovascular negative   Gastrointestinal negative   Genitourinary negative   Musculoskeletal negative   Neurological negative   Psychiatric negative   Hematology/Lymphatics negative   Endocrine negative   Allergic/Immunologic negative   Review of Systems   review of systems obtained from nurses notes  Nursing Notes:  Nursing Vital Signs and Chemo Nursing Nursing Notes: *CC Vital Signs Flowsheet:   23-Apr-15 13:48  Temp Temperature 97.3  Pulse Pulse 80  Respirations Respirations 20  SBP SBP 96  DBP DBP 65  Pain Scale (0-10)  4  Current Weight (kg) (kg) 70.3  Height (cm) centimeters 167  BSA (m2) 1.7   Physical Exam:  General/Skin/HEENT:  General normal   Skin normal   Eyes normal  Additional PE well-developed female appears older than stated age. Oral cavity shows teeth in extremely poor condition with most teeth grounded down to nubs. She does have some prominence in the left tonsillar region going back towards the left base of tongue. She is status post right radical neck dissection. Indirect mirror examination shows upper airway clear vallecula  within normal limits. No evidence of sub-digastric cervical or supraclavicular adenopathy is appreciated. Lungs are clear to A&P cardiac examination shows regular rate and rhythm.   Breasts/Resp/CV/GI/GU:  Respiratory and Thorax normal   Cardiovascular normal   Gastrointestinal normal   Genitourinary normal   MS/Neuro/Psych/Lymph:  Musculoskeletal normal   Neurological normal   Lymphatics normal   Other Results:  Radiology Results: LabUnknown:    21-Apr-15 12:14, PET/CT Scan Head/Neck CA Initial Staging  PACS Image   Nuclear Med:  PET/CT Scan Head/Neck CA Initial Staging   REASON FOR EXAM:    Inital staging head  neck Ca  COMMENTS:       PROCEDURE: PET - PET/CT INIT STAG HEAD/NECK CA  - Aug 19 2013 12:14PM     CLINICAL DATA:  Initial treatment strategy for head neck cancer  (squamous cell carcinoma) of unknown primary.    EXAM:  NUCLEAR MEDICINE PET SKULL BASE TO THIGH    TECHNIQUE:  10.8 mCi F-18 FDG was injected intravenously. Full-ring PET imaging  was performed from the skull base to thigh after the radiotracer. CT  data was obtained and used for attenuation correction and anatomic  localization.    FASTING BLOOD GLUCOSE:  Value: 87 mg/dl    COMPARISON:  Chest CT 07/29/2013.    FINDINGS:  NECK    Today's study demonstrates masslike enlargement of the left  tonsil-glossal sulcus, with diffuse hypermetabolismthroughout this  region (SUVmax = 8.4), suggestive of the patient's primary  malignancy. 9 mm level 1B lymph node (image 69 of series 3) is  hypermetabolic (SUVmax = 4.9). 7 mm left level 4 lymph node (image  80 of series 3) is hypermetabolic (SUVmax= 4.7). There is also some  low-level hypermetabolism in the left submandibular gland, which is  favored be physiologic given recent resection of the right  submandibular gland. Postoperative changes of radical neck  dissection on the right.    CHEST    No hypermetabolic mediastinal or hilar nodes. No  suspicious  pulmonary nodules on the CT scan. Heart size is normal. There is no  significant pericardial fluid, thickening or pericardial  calcification. There is atherosclerosis of the thoracic aorta, the  great vessels of the mediastinum and the coronary arteries,  including calcified atherosclerotic plaque in the right coronary  arteries. Esophagus is unremarkable in appearance.  ABDOMEN/PELVIS    No abnormal hypermetabolic activity within the liver, pancreas,  adrenal glands, or spleen. No hypermetabolic lymph nodes in the  abdomen or pelvis. Status post cholecystectomy. No significant  volume of ascites. No pneumoperitoneum. No pathologic distention of  small bowel.    SKELETON    No focal hypermetabolic activity to suggest skeletal metastasis.     IMPRESSION:  1. Findings, as above, concerning for probable primary squamous cell  carcinoma in the region of the left tonsil-glossal sulcus with  associated left-sided level 1B and level4 metastatic lymph nodes.  2. There is also some low-level hypermetabolism within the left  submandibular gland, which is favored to be physiologic given recent  resection of the right submandibular gland.  3. No evidence to suggest distant metastatic disease in the chest,  abdomen  or pelvis.  4. Atherosclerosis, including right coronary artery disease.      Electronically Signed    By: Trudie Reed M.D.    On: 08/19/2013 14:40         Verified By: Florencia Reasons, M.D.,   Relevent Results:   Relevant Scans and Labs PET CT scan reviewed   Assessment and Plan: Impression:   stage III squamous cell carcinoma of left tonsillar region base of tongue to undergo concurrent chemoradiation with curative intent Plan:   at this time we need to address her poor dental status and I have referred her to dental clinic for possible total teeth extraction. Surely thereafter we'll see her back for followup and radiation treatment planning. We'll  plan on delivering 7000 cGy to her left tonsillar region and positive nodes by PET CT criteria treating the remainder of lymph nodes in her head and neck region up to 5400 cGy using IM RT dose pinking technique. Risks and benefits of treatment including dysphasia, increased xerostomia, skin reaction, alteration of blood counts, and permanent swelling of her head and neck region all were discussed in detail with the patient. She seems to comprehend my treatment plan well. I have discussed the case personally with medical oncology. We'll coordinate her concurrent chemotherapy.  I would like to take this opportunity for allowing me to participate in the care of your patient..  CC Referral:  cc: Dr. Willeen Cass   Electronic Signatures: Bridey Brookover, Gordy Councilman (MD)  (Signed 23-Apr-15 15:33)  Authored: HPI, Diagnosis, Past Hx, PFSH, Allergies, Home Meds, ROS, Nursing Notes, Physical Exam, Other Results, Relevent Results, Encounter Assessment and Plan, CC Referring Physician   Last Updated: 23-Apr-15 15:33 by Rebeca Alert (MD)

## 2014-08-22 NOTE — H&P (Signed)
PATIENT NAME:  Deborah Watson, Deborah Watson MR#:  [id number] DATE OF BIRTH:  Jun 21, 1960  DATE OF ADMISSION:  07/01/2013  PRIMARY CARE PHYSICIAN:  Unknown.  She states that she does not have one.   CHIEF COMPLAINT: Abdominal pain.   HISTORY OF PRESENT ILLNESS: This is a 54 year old female with anxiety, depression and recently found to have a right neck mass. She has been on and off antibiotics recently by Dr. Willeen Cass, ENT.  She presents with nausea, vomiting too many times to count; abdominal pain, severe. She received pain medications and Haldol in the ER and was unable to give much of a history. She is unable to describe the pain except that it is severe. No radiation. Nothing makes it better or worse. In the ER, a lipase was found to be greater than 10,000 and hospitalist services were contacted for further evaluation. On further questioning, the patient states that she did have a history of pancreatitis in the past and did have history of alcohol in the past. She quit drinking on January 25th of this year. She used to drink 3 to 5 cans of beer per day.   PAST MEDICAL HISTORY: Anxiety, depression, right neck mass, history of pancreatitis in the past, history of alcohol abuse in the past.   PAST SURGICAL HISTORY: Cholecystectomy.   ALLERGIES: No known drug allergies.   MEDICATIONS: Include Augmentin 875/125, 1 tablet twice a day for 10 days; Celexa 20 mg daily, Seroquel 100 mg at bedtime, Zofran ODT 4 mg 3 times a day as needed for nausea and vomiting.   SOCIAL HISTORY: Smokes 1-1/2 packs per day. Quit alcohol, January 25th; did drink 3 to 5 cans of beer per day prior to that. Occasional marijuana abuse. Currently not working.   FAMILY HISTORY: Difficult to obtain. I think her mother died of some lung issue. Father is healthy.      REVIEW OF SYSTEMS:   CONSTITUTIONAL: Positive for freezing cold. No fever or chills. Positive for fatigue. No weight loss. No weight gain.  EYES: No blurry vision.  EARS,  NOSE, MOUTH AND THROAT: Positive for runny nose. No sore throat. No difficulty swallowing.  CARDIOVASCULAR: No chest pain. No palpitations.  RESPIRATORY: No shortness of breath. No cough. No sputum. No hemoptysis.  GASTROINTESTINAL: Positive for nausea, vomiting. Positive for abdominal pain. No diarrhea. No constipation. No bright red blood per rectum. No melena.  GENITOURINARY: No burning on urination or hematuria.  MUSCULOSKELETAL: No joint pain or muscle pain.  INTEGUMENT: No rashes or eruptions.  NEUROLOGIC: No fainting or blackouts.  PSYCHIATRIC: Positive for anxiety and depression.  ENDOCRINE: No thyroid problems.  HEMATOLOGIC AND LYMPHATIC: No anemia.   PHYSICAL EXAMINATION: VITAL SIGNS: Temperature 97.6, pulse 64, respirations 18, blood pressure 141/71, pulse ox 100% on room air.  GENERAL: No respiratory distress.  EYES: Conjunctivae and lids normal. Pupils equal, round and reactive to light. Extraocular muscles intact. No nystagmus.  EARS, NOSE, MOUTH AND THROAT: Tympanic membranes: No erythema. Nasal mucosa: No erythema. Throat: No erythema. No exudate seen. Lips and gums: No lesions.  NECK: No JVD. No bruits. No lymphadenopathy. No thyromegaly. No thyroid nodules palpated.  RESPIRATORY: Lungs clear to auscultation. No use of accessory muscles to breathe. No rhonchi, rales or wheeze heard.  CARDIOVASCULAR: S1, S2 normal. No gallops, rubs or murmurs heard. Carotid upstroke 2+ bilaterally and no bruits. Dorsalis pedis pulses 2+ bilaterally. Trace edema of the lower extremities.  ABDOMEN: Soft. Positive tenderness in the epigastric area. No organomegaly/splenomegaly. Normoactive bowel  sounds. No masses felt.  LYMPHATIC: No lymph nodes in the neck.  MUSCULOSKELETAL: No clubbing, edema or cyanosis.  SKIN: No rashes or ulcers seen.  NEUROLOGIC: Cranial nerves II through XII grossly intact. Deep tendon reflexes 2+ bilateral lower extremities.  PSYCHIATRIC: The patient is alert, oriented  to person and place, but is confused, I think, with  the Haldol that was given early.   EKG: Normal sinus rhythm, 60 beats per minute, flipped T waves laterally.   ASSESSMENT AND PLAN: 1.  Acute pancreatitis with lipase greater than 10,000. We will give IV fluid hydration, keep n.p.o. overnight. IV pain medication, IV nausea medication. I ordered a stat triglyceride level, which came back at 197. With her history of alcohol, I believe that this is the culprit. The patient did have her gallbladder out. ER physician ordered an ultrasound, which showed a distended common bile duct and central intrahepatic ducts postcholecystectomy. Liver function test bilirubin was actually normal. Alkaline phosphatase only slightly elevated at 136, ALT 32 and AST 80.  I will put the patient on CIWA protocol just in case.  2.  Anxiety, depression. Continue Celexa and Seroquel.  3. Right neck mass. Dr. Willeen CassBennett, ENT, is following as outpatient and has the patient on Augmentin. I will switch that over to Unasyn.  4. Tobacco abuse. Smoking cessation counseling done 3 minutes by me. Nicotine patch applied.  5.  Hypomagnesemia. We will replace magnesium.  6.  Chronic kidney disease stage III. I will give IV fluid hydration to see if this helps out.   TIME SPENT ON ADMISSION: 55 minutes.    ____________________________ Herschell Dimesichard J. Renae GlossWieting, MD rjw:[id number] D: 07/01/2013 19:53:00 ET T: 07/01/2013 20:15:27 ET JOB#: [id number]  cc: Herschell Dimesichard J. Renae GlossWieting, MD, <Dictator> Salley ScarletICHARD J Carlee Tesfaye MD ELECTRONICALLY SIGNED 07/10/2013 16:18

## 2014-08-22 NOTE — Consult Note (Signed)
PATIENT NAME:  Deborah GrayFOLLIETT, Lorel K MR#:  [id number] DATE OF BIRTH:  1960-06-01  DATE OF CONSULTATION:  02/25/2014  CONSULTING PHYSICIAN:  Audery AmelJohn T. Mavis Gravelle, MD  IDENTIFYING INFORMATION AND REASON FOR CONSULTATION: A 54 year old woman with a history of head and neck cancer who presented to the Emergency Room with several complaints including her persistent vomiting and possibly some recent falls as well as her ongoing anxiety and some suicidal ideation.   HISTORY OF PRESENT ILLNESS: Information obtained from the patient and the chart. The patient is also known to me from having been seen in the Emergency Room recently for a similar situation. She reports that she continues to feel very anxious all the time. She tells me that her Valium was stopped about 10 days ago. Her doctor told her that she could have her choice of either her pain medicine or her Valium so she gave up the Valium. Since then, she has been feeling jittery and anxious. Sleep is poor. She feels depressed, some degree of hopelessness. She has had suicidal thoughts without a specific plan. She says that at times she does also have hallucinations, although she cannot describe them very well. She claims to no longer be taking any Valium or other benzodiazepines. She has not been to [facility] for mental health treatment since we saw her last. She says that she is taking Celexa 20 mg a day.   PAST PSYCHIATRIC HISTORY: The patient reports she has [facility] the [facility], [facility], but denies any history of suicide attempts or violence. Unclear what diagnosis she had. It sounds like she has had recurrent problems with some degree of anxiety and depression. She could recall some antidepressants and mostly seemed to remember antianxiety medicines that she has had past.   PAST MEDICAL HISTORY: History of head and neck cancer, with recent completion of radiation treatment. Has chronic pain in her head and neck and throat  limiting her comfort with eating. History of gallbladder surgery in the past.   SOCIAL HISTORY: Lives with a friend who is not a romantic partner. She is divorced, has 3 adult children. Apparently a close relative of hers has suggested that [place] back to [place] and live with the family. This is planned to happen sometime in about the next 3 weeks. The patient is feeling very anxious about whether she wants to do this.   FAMILY HISTORY: Bipolar disorder and anxiety in family members.   SUBSTANCE ABUSE HISTORY: Prior history of alcohol, but had stopped for a long time before some restarting. Says that now she is not drinking currently still has some occasional marijuana use.   REVIEW OF SYSTEMS: Anxious almost panicky most of the time. Poor sleep. A little bit dizzy at times, increased frequency of falls. Pain in her mouth and throat, limiting her ability to eat anything, chronic nausea and vomiting. The rest of the review of systems negative.   MENTAL STATUS EXAMINATION: The patient who looks significantly older than her stated age, chronically ill-looking woman. Passively cooperative. Eye contact intermittent. Psychomotor activity very slow. Speech was slurred at times probably because of the pain she has in her mouth. I can understand her but I had to ask her to speak louder many times. Thoughts are not psychotic, a little bit concrete. No evidence of delusions. Denies any visual hallucinations, but has had occasional auditory hallucination she cannot describe. She endorses suicidal ideation without specific plan. No homicidal ideation. She could recall 3/3 objects immediately,  2/3 at 3 minutes. She was alert and oriented x 4. Judgment and insight somewhat impaired, especially regarding her treatment of her illness. She has not been doing a very good job following up with the directions.   LABORATORY RESULTS: Lipase is low at 54. Chemistry panel: Elevated glucose 171. Hematology panel: Elevated  white count at 13, otherwise normal. Drug screen positive for cannabis, barbiturates, and benzodiazepines. Urinalysis 2+ ketones. Does not appear to be infected. Acetaminophen and salicylates negative.   VITAL SIGNS: The blood pressure in the Emergency Room is 210/108, respirations 22, pulse 108, temperature 98.4.   ASSESSMENT: A 54 year old woman who is a frequent user of emergency services is today reporting suicidal ideation. Extremely anxious, multiple symptoms of depression. The patient at the time of this evaluation does not appear safe to go home. We have discussed admission to our hospital last week and there was significant concern about her medical needs and the appropriateness to our unit. If anything she looks sicker today than she did last week. Based on that I am going to refer her to a geriatric psychiatry unit. Despite her being only 54 years old, everything about her presentation seems more typical of a geriatric psychiatry patient. Continue current medicine. We will try and work on getting her blood pressure down. She obviously needs to be eating better. She is starving right now and probably taking excessive medication to compensate for it.   DIAGNOSIS, PRINCIPAL AND PRIMARY:   AXIS I: Major depression, recurrent, severe.   SECONDARY DIAGNOSES:  AXIS I: Chronic pain syndrome.  Rule out benzodiazepine abuse, rule out opiate abuse.   AXIS III: Chronic pain.    ____________________________ Audery Amel, MD jtc:lt D: 02/25/2014 15:18:45 ET T: 02/25/2014 16:02:09 ET JOB#: [id number]  cc: Audery Amel, MD, <Dictator> Audery Amel MD ELECTRONICALLY SIGNED 03/07/2014 14:54

## 2014-08-22 NOTE — Consult Note (Signed)
PATIENT NAME:  Deborah GrayFOLLIETT, Malyna K MR#:  [id number] DATE OF BIRTH:  10-12-1960  DATE OF CONSULTATION:  02/03/2014  REFERRING PHYSICIAN:   CONSULTING PHYSICIAN:  Audery AmelJohn T. Clapacs, MD  IDENTIFYING INFORMATION AND REASON FOR CONSULTATION: This is a 54 year old woman with a history of depression and cancer who came into the Emergency Room complaining of intractable vomiting. Consult for depression.   HISTORY OF PRESENT ILLNESS: Information obtained from the patient and the chart. The patient reports that she has been feeling extremely depressed. She has chronic depression and panic symptoms, but for the last few months things have been much worse. She feels down all the time. She feels constantly anxious and on top of that has panic attacks intermittently. Feels hopeless. Has been having some recent thoughts about killing herself based on the severity of her pain. Has no energy. Not doing much at home. She has been compliant with her current prescribed medications, Celexa and Seroquel. She sleeps very poorly and intermittently. She is not able to eat and has lost a great deal of weight. She does not report active psychotic symptoms. She recently restarted drinking within the last couple of months. Was having about a 40 ounce can of beer a day. Says it has been probably about 5 days since the last one. Uses marijuana occasionally. [facility] sees [facility]itz [facility].   PAST PSYCHIATRIC HISTORY: The patient has had years of mental health problems with anxiety and depression that predate her current cancer, although she said they came on in her 4440s. She cannot remember any other medicines besides her current Xanax, Celexa and Seroquel that she has ever been prescribed, except for maybe Lexapro. She denies any history of suicide attempts in the past or history of violence. She has been in psychiatric hospitals in the past, [facility]   PAST MEDICAL HISTORY: Head and neck cancer, recently finished  up with radiation therapy. Has lost a lot of weight and has pain in her neck. Also has a history of gallbladder surgery in the past. Has now chronic pain. Otherwise, no ongoing medical problems.   SOCIAL HISTORY: Lives with a friend who is not a romantic partner. She is divorced, has 3 adult children. Does very little during the day, has limited social interactions.   FAMILY HISTORY: Bipolar disorder and severe anxiety in family members.   SUBSTANCE ABUSE HISTORY: Used to have a drinking problem and stopped for a long period of time before restarting in the last couple of months. Has not had a drink in several days. No history of DTs.   Marijuana abuse intermittent, still going on.   REVIEW OF SYSTEMS: Severe pain in her jaw and neck. Depressed mood. Tearfulness, hopelessness. Low energy. Cannot eat well, weight loss. Cannot sleep well. No hallucinations. Nausea and vomiting frequently. Otherwise, negative review of systems.   MENTAL STATUS EXAMINATION: Chronically ill-looking woman, looks like she has lost a lot of weight, looks older than her stated age, cooperative with the interview. Eye contact good. Psychomotor activity very slow and sluggish. Speech is decreased in total amount and whispered at times. Affect dysphoric, tearful and anxious. Mood stated as bad. Thoughts are lucid but slow. No obvious delusions. Denies hallucinations. Endorses suicidal ideation. No homicidal ideation, no specific plan. Could recall 3/3 objects immediately and at three minutes. Alert and oriented x4. Judgment and insight adequate. Intelligence normal.   LABORATORY RESULTS: Drug screen positive for cannabis and benzodiazepines. Alcohol negative. Troponins and lipase negative.  Chemistry panel: Slightly low potassium at 3.3. CBC: White count of 12 and hemoglobin 11.9.   VITAL SIGNS AND PHYSICAL EXAMINATION: Looks like she is in chronic pain and has clearly lost weight, but is able to walk without assistance. Blood  pressure 160/84, respirations 20, pulse 100, temperature 99. No mouth teeth. Mouth clearly looks sore.  ASSESSMENT: A 54 year old woman with major medical problems and major depression, recent suicidal ideation, poor functioning, declining appetite and poor health, requires inpatient hospital treatment for stabilization.   TREATMENT PLAN: Admit to inpatient psychiatry. Primary treatment team can work on adjusting medication. Supportive and educational counseling done. Review labs.   DIAGNOSIS, PRINCIPAL AND PRIMARY:  AXIS I: Major depression, severe, recurrent.   SECONDARY DIAGNOSES: AXIS I: Alcohol abuse.  AXIS II: Deferred.  AXIS III: Recent head and neck cancer, history of gallbladder surgery, weight loss, effects of radiation.  AXIS V: Functioning at time of admission 30.   ____________________________ Audery Amel, MD jtc:sb D: 02/03/2014 11:35:11 ET T: 02/03/2014 11:49:58 ET JOB#: [id number]  cc: Audery Amel, MD, <Dictator> Audery Amel MD ELECTRONICALLY SIGNED 02/09/2014 0:22

## 2014-08-22 NOTE — Consult Note (Signed)
PATIENT NAME:  Deborah Watson, [name] MR#:  [id number] DATE OF BIRTH:  11-09-1960  DATE OF CONSULTATION:  07/02/2013  REFERRING PHYSICIAN:  Vivek J. Cherlynn KaiserSainani, MD CONSULTING PHYSICIAN:  Christena DeemMartin U. Caci Orren, MD  REASON FOR CONSULTATION: Acute pancreatitis, dilated intrahepatic ducts.   HISTORY OF PRESENT ILLNESS: Ms. Deborah Watson is a 54 year old Caucasian female who was admitted to the hospital yesterday with abdominal pain, nausea, vomiting, and diagnosed with acute pancreatitis. She had an abdominal ultrasound done yesterday that indicated dilated intrahepatic and extrahepatic ducts. She states that she has had one previous episode of pancreatitis, this being in about October 2014, that was probably alcohol related. She states she is feeling very much better, and indeed requested something to eat this morning. She denies any current abdominal pain, nausea or vomiting today. She has recently been on Augmentin, then possibly a course of Cipro, and then another course of Augmentin for a right neck mass that is currently undergoing evaluation by Dr. Willeen CassBennett. She is due to have surgery on this in about a week or week and a half. The Seroquel she has been on for well over 6 months. In review, she had a CT scan 04/23/2013 that showed no evidence of ductal dilation. She states that she has a long history of alcohol abuse, ever since probably her early 5520s to late teens. She does state, however, she has had no alcohol since May 25, 2013, after a previous bout of possible pancreatitis. She did have an EGD done in July 2013 at the time of her cholecystectomy, that showing a grade C erosive esophagitis and some gastritis. She did undergo cholecystectomy on 11/11/2011 for acute cholecystitis. However, there was no intraoperative cholangiogram done at that time. She stated that about 2 days ago, she had eaten some food that was relatively high-fat from her description and had some nausea and epigastric discomfort. Then this  repeated again yesterday, only worse. She came to the Emergency Room and was admitted to the hospital. She has been taking Goody[employer] Powders, from 2 to 4 daily for quite some time. She has undergone detox for alcohol at least 5 times. She was drinking 3 to 5 beers a day up until January. Currently she states her pain is a zero out of 10, and is drinking some broth as I am discussing this with her. She denies any problems with diarrhea or constipation. She has seen no black or bloody stools. No mucus in the stools. She has regular bowel habits.   PAST MEDICAL HISTORY: History of anxiety and depression, history of alcohol abuse as noted, history of gallstones and subsequent cholecystectomy, history of insomnia.   GASTROINTESTINAL FAMILY HISTORY: Negative for colorectal cancer, liver disease, or ulcers.   PHYSICAL EXAMINATION: VITAL SIGNS: Temperature is 98.2, pulse 80, respirations 20, blood pressure 137/75, pulse oximetry 97%.  GENERAL: She is 54 year old Caucasian female in no acute distress. Denies any current abdominal pain or nausea.  HEENT: Normocephalic, atraumatic. Eyes are anicteric. Nose: Septum midline. No lesions. Oropharynx: No lesions.  NECK: Supple. She has a mass on the right side below the mandible. There is no adenopathy otherwise.  HEART: Regular rate and rhythm.  LUNGS: Clear.  ABDOMEN: Soft, nontender, nondistended. Bowel sounds positive and normoactive.  RECTAL: Anorectal exam deferred.  EXTREMITIES: No clubbing, cyanosis, or edema.  NEUROLOGICAL: Cranial nerves II through XII grossly intact. Muscle strength bilaterally equal and symmetric, 5 out of 5. Deep tendon reflexes bilaterally equal and symmetric.   LABORATORY AND RADIOLOGICAL DATA: From yesterday  evening include the following: She had a glucose of 117, BUN 30, creatinine 1.24, sodium 135, potassium 3.5, chloride 100, bicarbonate 26, calcium 9.2, magnesium 1.7. Triglycerides 197. Lipase greater than 10,000. Serum alcohol  less than 3. Her hepatic profile showed a total protein of 83, albumin 3.7, total bilirubin 0.5, alkaline phosphatase 136, AST 80, ALT 32. Troponin I x 1 was done, less than 0.02. She had urine drug screen that was positive for cannabinoids and tricyclic antidepressant. She had on admission a white count of 14.6, hemoglobin and hematocrit of 14.9 and 44.5, platelet count of 352. UA showing negative nitrite, 30 mg/dL protein, trace ketones, no bacteria. Repeat labs this morning showed her white count down to 10.1, her lipase at 1391. A hepatic panel was not repeated. Her BUN is now 26 with a creatinine of 0.88. Her ultrasound that was done yesterday showed a distended common bile duct and central intrahepatic ducts, post cholecystectomy, no evidence of biliary obstruction.   ASSESSMENT: Recurrent pancreatitis in the setting of a prior history of alcohol abuse. The patient states that she has not had any alcohol since January 25. Her LFTs on admission were consistent with an alcohol-related pattern, although not greatly elevated. Whether she is still using alcohol would be a question. Her dilated intrahepatic and extrahepatic biliary ducts are apparently relatively new, as she had a CT scan 04/23/2013 that did not show any ductal dilation. It is possible that she had a small stone that may have trapped, even though she has had a gallbladder removal surgery. That would account for a quick rise of the lipase and a very quick drop of the lipase as well. The question would be whether or not she has any other ductal anomaly. Again, there was no intraoperative cholangiogram done at the time of her cholecystectomy. Other etiologies to consider would be things like high triglycerides; however, this was not elevated to the point of concern for pancreatitis on admission. Other etiologies such as autoimmune pancreatitis or hereditary pancreatitis of much lower concern.   RECOMMENDATIONS: 1.  Will repeat LFTs, lipase, and an  IgG4 tomorrow morning.  2.  Would recommend further imaging in regards to an MRCP. This would be better for evaluating possible retained stones or other debris in the biliary system, rather than a repeat CT scan.  3.  Would recommend low-fat diet.   Thank you for this consult. Will follow with you.  ____________________________ Christena Deem, MD mus:jcm D: 07/02/2013 15:30:44 ET T: 07/02/2013 16:31:33 ET JOB#: [id number]  cc: Christena Deem, MD, <Dictator> Christena Deem MD ELECTRONICALLY SIGNED 07/07/2013 15:35

## 2014-08-22 NOTE — Consult Note (Signed)
PATIENT NAME:  Deborah Watson, [name] MR#:  [id number] DATE OF BIRTH:  Dec 13, 1960  DATE OF CONSULTATION:  02/26/2014  CONSULTING PHYSICIAN:  Audery AmelJohn T. Koden Hunzeker, MD  HISTORY OF PRESENT ILLNESS:  This is a followup Emergency Room note for this patient with depression, who was seen yesterday. Yesterday, she was professing suicidal ideation and her depression appeared to be extreme. The plan was for referral to a geriatric psychiatric bed. Today on reassessment, the patient says her mood is feeling better. She denies any suicidal thought. She indicates that she feels more optimistic and hopeful about the future and has plans for herself when she gets out of the hospital. She is alert and oriented and lucid.   REVIEW OF SYSTEMS:  She continues to have trouble swallowing but is making more of an effort to get soft foods down and otherwise has no new complaint.   MENTAL STATUS EXAMINATION:  She is awake, alert, well groomed. Eye contact is good. Psychomotor activity:  Much more energetic. Affect is still a little blunted. Mood is stated as better. Thoughts are lucid without loosening of associations. No evidence of psychosis. Denies suicidal or homicidal ideation. Alert and oriented x 4. Judgment and insight are adequate.   ASSESSMENT:  The patient's depression appears to be less severe today, and she is denying suicidal ideation. She is requesting release from the Emergency Room. The patient is not under involuntary commitment and does not meet commitment criteria. The patient was educated and supportive counseling was done about the importance of continuing to follow up with [facility] for her psychiatric care and continue to take care of her medical condition.   DIAGNOSIS, PRINCIPAL AND PRIMARY:  AXIS I:  Major depression, recurrent, severe.    ____________________________ Audery AmelJohn T. Jacier Gladu, MD jtc:nb D: 02/26/2014 20:45:00 ET T: 02/26/2014 23:32:17 ET JOB#: [id number]  cc: Audery AmelJohn T. Aahil Fredin, MD, <Dictator> Audery AmelJOHN T  Adonijah Baena MD ELECTRONICALLY SIGNED 03/07/2014 14:54

## 2014-08-22 NOTE — Discharge Summary (Signed)
PATIENT NAME:  Deborah GrayFOLLIETT, Yovanna K MR#:  [id number] DATE OF BIRTH:  [birth year]  DATE OF ADMISSION:  07/01/2013 DATE OF DISCHARGE:  07/03/2013  For a detailed note, please take a look at the history and physical done on admission by Dr. Alford Highlandichard Wieting.   DIAGNOSES AT DISCHARGE:  Are as follows: Acute pancreatitis, likely related to alcohol abuse, depression, a right neck mass.   The patient is being discharged on a low-sodium, low-fat diet.   ACTIVITIES:  As tolerated.   FOLLOWUP:  The  patient needs to get himself a primary care physician.    DISCHARGE MEDICATIONS: Celexa 20 mg daily, Seroquel 100 mg daily, Zofran 4 mg t.i.d. as needed and Augmentin 875/125, 1 tab b.i.d. x 10 days.   CONSULTANTS DURING THE HOSPITAL COURSE: Dr. Barnetta ChapelMartin Skulskie from gastroenterology.   PERTINENT STUDIES DONE DURING THE HOSPITAL COURSE: An ultrasound of the abdomen, limited, showing distended common bile duct and central intrahepatic biliary ductal dilatation with postcholecystectomy. M.R.C.P. done showing common bile duct measuring 8 mm, which is considered within normal limits status.  Status post cholecystectomy. No evidence of choledocholithiasis or common bile duct obstruction. Normal appearance of the pancreas and pancreatic duct.   HOSPITAL COURSE: This is a 54 year old female with medical problems as mentioned above, presented to the hospital with abdominal pain, nausea, vomiting with an elevated lipase of greater than 10,000 and noted to be an acute pancreatitis.  1. Acute pancreatitis. The exact etiology of the pancreatitis is unclear but suspected to be secondary to alcohol abuse. The patient is already status post cholecystectomy. The patient had a limited abdominal ultrasound, which was negative. She was treated supportively with IV fluids, antiemetics and pain control with bowel rest. Her lipase dramatically came down within the next 24 to 48 hours. I did obtain a GI consult, thinking this was biliary  in nature. The patient was seen by Dr. Marva PandaSkulskie, who ordered an M.R.C.P., which was essentially normal. Her triglyceride levels were  normal too.  At this point, the cause of her pancreatitis is presumed to be alcohol in nature. It has significantly improved and resolved. She is tolerating p.o. well with no nausea, vomiting, and therefore being discharged home.  2.  Depression. The patient was maintained on Celexa and Seroquel. She will resume that.  3. Right neck mass. The patient is being followed by Dr. Willeen CassBennett, and he is supposed to do an excisional biopsy of the mass coming up in the next week or so. She was maintained on some Augmentin. She will continue that and continue followup with ENT as an outpatient.   The patient is a FULL CODE.   TIME SPENT: 40 minutes.    ____________________________ Rolly PancakeVivek J. Cherlynn KaiserSainani, MD vjs:dmm D: 07/03/2013 15:57:16 ET T: 07/03/2013 22:09:09 ET JOB#: [id number]  cc: Rolly PancakeVivek J. Cherlynn KaiserSainani, MD, <Dictator> Houston SirenVIVEK J Halsey Hammen MD ELECTRONICALLY SIGNED 07/13/2013 22:35

## 2014-08-22 NOTE — Consult Note (Signed)
Brief Consult Note: Diagnosis: acute recurrent pancreatitis.   Patient was seen by consultant.   Consult note dictated.   Recommend further assessment or treatment.   Orders entered.   Discussed with Attending MD.   Comments: Please see full GI consult.  Paitent admitted with acute recurrent pancreatitis in the setting of long h/o  etoh abuse with possible recent start of abstinence.  LFT pattern shows possible etoh use.  Triglycerides not elevated enough to be of concern for pancreatitis.  No history of autoimmune issues.  H/o CCY without IOC in 10/2011.  Patient  feeling much better, denies pain, wanting to eat.  Of note, abd us on admission showing dilated intra and extra-hepatic bile ducts, but CT 04/23/13 negative for dilated ducts.  Recommend MRCP, repeat labs in am, with IgG4, institution of  low fat diet, reinforcement of etoh abstinence.  Following.  Electronic Signatures: Barnetta ChapelSkulskie, Kayceon Oki (MD)  (Signed 04-Mar-15 15:38)  Authored: Brief Consult Note   Last Updated: 04-Mar-15 15:38 by Barnetta ChapelSkulskie, Woodfin Kiss (MD)

## 2014-08-23 NOTE — Consult Note (Signed)
Chief Complaint:   Subjective/Chief Complaint EGD is unremarkable except for reflux esophagitis and mild gastritis. Will consult surgery due to persistant vomiting and gallstones. Will follow.   Electronic Signatures: Lurline DelIftikhar, Brinna Divelbiss (MD)  (Signed 12-Jul-13 13:28)  Authored: Chief Complaint   Last Updated: 12-Jul-13 13:28 by Lurline DelIftikhar, Jacorian Golaszewski (MD)

## 2014-08-23 NOTE — Consult Note (Signed)
PATIENT NAME:  Deborah Watson, Deborah Watson DATE OF BIRTH:  1960/09/12  DATE OF CONSULTATION:  11/10/2011  REFERRING PHYSICIAN:   CONSULTING PHYSICIAN:  Earvin HansenGerald L. Pernell DupreAdams, MD  HISTORY OF PRESENT ILLNESS: This is a [age] year old woman who was admitted to the hospital with some upper abdominal pain and rather profuse nausea and vomiting. This has been evaluated and gallbladder ultrasound shows multiple small stones but no gallbladder wall thickening and no intrahepatic duct dilatation or evidence of common bile duct stones. The patient had an upper GI endoscopy done today which shows some mild reflux gastritis and some antral gastritis but nothing of any great import. No ulcers or bleeding. The patient has not had any previous abdominal surgery. She does have a problem with anxiety and depression and is being seen for that.   PHYSICAL EXAMINATION: Limited to the abdomen. There are no surgical scars. There is some tenderness in the epigastrium and left upper quadrant and a little tenderness in the right subcostal region. There is no guarding or rigidity. No palpable mass. Bowel sounds are normal.   IMPRESSION: Cholelithiasis, reflux esophagitis, and gastritis.   DISPOSITION: I think the patient would benefit from a cholecystectomy, and she is certainly a good candidate for a laparoscopic procedure. We will attempt to get this done as soon as possible. However, she is being seen on a weekend.   IMPRESSION: Cholelithiasis.    ____________________________ Deborah GuthrieGerald L. Pernell DupreAdams, MD gla:vtd D: 11/10/2011 20:17:59 ET T: 11/11/2011 08:59:59 ET JOB#: [id number]  cc: Earvin HansenGerald L. Pernell DupreAdams, MD, <Dictator> Elmer SowGERALD L Paras Kreider MD ELECTRONICALLY SIGNED 11/11/2011 19:39

## 2014-08-23 NOTE — Consult Note (Signed)
Chief Complaint:   Subjective/Chief Complaint Patient with continued nausea and vomiting. Abdomen is soft and benign. US showed cholelithiasis without cholecystitis.  Recommendations: EGD in am. Further recommendations to follow.   Electronic Signatures: Lurline DelIftikhar, Chieko Neises (MD)  (Signed 11-Jul-13 17:11)  Authored: Chief Complaint   Last Updated: 11-Jul-13 17:11 by Lurline DelIftikhar, Darrell Leonhardt (MD)

## 2014-08-23 NOTE — Consult Note (Signed)
Chief Complaint:   Subjective/Chief Complaint Patient probably in surgery for cholecystectomy. Will follow.   Electronic Signatures: Lurline DelIftikhar, Felecity Lemaster (MD)  (Signed 13-Jul-13 12:27)  Authored: Chief Complaint   Last Updated: 13-Jul-13 12:27 by Lurline DelIftikhar, Vasilios Ottaway (MD)

## 2014-08-23 NOTE — Discharge Summary (Signed)
PATIENT NAME:  Deborah Watson, Deborah Watson MR#:  [id number] DATE OF BIRTH:  09-12-1960  DATE OF ADMISSION:  11/06/2011 DATE OF DISCHARGE:  11/12/2011  PRINCIPLE DIAGNOSIS DURING THIS ADMISSION: Symptomatic cholelithiasis.   OTHER DIAGNOSES:  1. Depression / anxiety disorder. 2. Gastroesophageal reflux disease.   PRINCIPLE PROCEDURE PERFORMED DURING THIS ADMISSION: Laparoscopic cholecystectomy (11/11/2011).   OTHER PROCEDURES PERFORMED DURING THIS ADMISSION: EGD (11/10/2011).   HOSPITAL COURSE: [name] was admitted to the hospital and had an evaluation that included a surgical evaluation following an EGD that was fairly normal and psychiatric evaluation where in she was placed back on her psychotropic medications. She was then taken to the Operating Room where she underwent the above-mentioned procedure for the above-mentioned diagnosis and on postoperative day one she was doing excellent. She was discharged home with a one year supply of Celexa 20 mg daily, a one-year supply of Wellbutrin SR 100 mg q.a.m., a one-year supply of Zyprexa 5 mg q.p.m. and 24 Norco (5/325) to be taken 1 to 2 q.4-6 hours p.r.n. for pain. She was asked to make an appointment to see me in 1 to 2 weeks for follow up and to call my office for any problems in the interim.   ____________________________ Claude MangesWilliam F. Ritisha Deitrick, MD wfm:cms D: 11/12/2011 09:39:56 ET T: 11/12/2011 12:29:40 ET JOB#: [id number]  cc: Claude MangesWilliam F. Jeyson Deshotel, MD, <Dictator> Dr. Cena Bentononrad  Wretha Laris F Jillann Charette MD ELECTRONICALLY SIGNED 11/13/2011 14:07

## 2014-08-23 NOTE — Consult Note (Signed)
PATIENT NAME:  Deborah Watson, Deborah Watson MR#:  [id number] DATE OF BIRTH:  [birth year]  DATE OF CONSULTATION:  11/07/2011  REFERRING PHYSICIAN:   CONSULTING PHYSICIAN:  Adelene AmasJames S. Fawn Desrocher, MD  REASON FOR CONSULTATION: Severe alcohol dependence, depression and anxiety.   HISTORY OF PRESENT ILLNESS: Deborah Watson is a 54 year old female admitted to the [facility] on 11/06/2011 due to protracted nausea and vomiting. She was drinking an unknown amount of beer per day. She stopped the day prior to admission. She developed nausea and vomiting and had to be admitted.   She has been experiencing depressed mood, low energy, difficulty concentrating, and decreased interest. She has no thoughts of harming herself or others. She has no hallucinations or delusions. Her memory and orientation and function are intact.   She states that her combination of Celexa and Trazodone were associated with good mood and lack of major depression symptoms; however, these symptoms returned after she relapsed on alcohol.   She has been placed on the Ativan detox protocol. She continues with some nausea and vomiting.   PAST PSYCHIATRIC HISTORY: Deborah Watson does have a history of recurrent periods of depression lasting longer than two weeks that involves anhedonia, poor concentration, poor energy, poor appetite and sleep disturbance.   From March of 2012 through 03/17 of this year she was sober. She was placed on Celexa 20 mg daily and trazodone 150 mg at bedtime for her depression with good result. Her primary care physician was giving her Xanax for antianxiety. This was stopped. She does have a history of excess worry, feeling on edge, and muscle tension.   FAMILY PSYCHIATRIC HISTORY: None known.   SOCIAL HISTORY: She has a boyfriend. She has a alcohol history, as described above. She does smoke marijuana.   OCCUPATION: Disabled.   PAST MEDICAL HISTORY:  1. Gastroesophageal reflux  disease. 2. Ulcers.  DRUG ALLERGIES: No known drug allergies.  MEDICATIONS: Her MAR is reviewed. She is on the detox protocol. She has been receiving Celexa 20 mg daily and trazodone 150 mg at bedtime.   LABORATORY DATA: Hepatic panel is unremarkable. BUN 22 and creatinine 1.32.  Urinalysis showed 2 epithelials and 2 WBCs.  CBC:  WBC was 14.1, hemoglobin normal, and platelet count normal   PSYCHIATRIC: She has no history of suicide attempts. She has undergone hospitalizations for substance detox.   REVIEW OF SYSTEMS: Constitutional, head, eyes, ears, nose, throat, mouth, neurologic, cardiovascular, respiratory, gastrointestinal, genitourinary, skin, musculoskeletal, hematologic, lymphatic, endocrine, and metabolic all unremarkable.   PHYSICAL EXAMINATION:   VITAL SIGNS: Temperature 98.4, pulse 63, respiratory rate 18, blood pressure 171/76.  GENERAL APPEARANCE: Deborah Watson is a middle-aged female lying in a left lateral decubitus position in her hospital bed. She has no abnormal involuntary movements. She is mildly cachectic. Muscle tone is decreased. Grooming is disheveled. Hygiene is normal.   MENTAL STATUS EXAMINATION: Deborah Watson has intermittent eye contact. Concentration mildly decreased. She is oriented to all spheres. Her abstraction is intact. Memory is intact to immediate, recent, and remote. His fund of knowledge, intelligence, and use of language are difficult to discern given her poverty of speech, however, they are grossly normal. Her speech is soft, impoverished. There is no dysarthria. There is a mildly flat prosody. Thought process is logical, coherent, and goal directed. No looseness of associations. Thought content - no thoughts of harming herself or others. No delusions or hallucinations. Affect is flat. Mood is mildly depressed. Insight is intact. Judgment is intact.  ASSESSMENT:  AXIS I:  1. Major depressive disorder, recurrent, moderate. It appears that alcohol  has been a major factor in undermining her maintenance medication treatment for depression.  2. Alcohol dependence.   AXIS II: Deferred.   AXIS III: Please see the past medical history.   AXIS IV: Primary support group.  AXIS V: 54.  Deborah Watson is not at risk to harm himself or others. She agrees to call emergency services immediately for any thoughts of harming himself, thoughts of harming others or distress.   The undersigned recommended that she proceed to an inpatient chemical dependence residential treatment program. This could be proceeded by a psychiatric admission if she is medically cleared soon and still requiring the Ativan taper. However, the patient declined attending any form of inpatient psychiatric care and she is not committable.   She states that she will consider going to outpatient substance abuse counseling and does not want to utilize the 12 step method. Outpatient substance abuse counseling  resources information can be obtained through the [facility] emergency department at 3715.          I would continue her Celexa at 20 mg daily and trazodone 150 mg at bedtime. Again, outpatient followup information can be obtained at 3715.   I would finish her alcohol detox protocol and encourage thiamine supplementation. ____________________________ Adelene Amas. Mazin Emma, MD jsw:slb D: 11/10/2011 12:34:55 ET T: 11/10/2011 12:51:49 ET JOB#: [id number]  cc: Adelene Amas. Bohdan Macho, MD, <Dictator> Lester [place] MD ELECTRONICALLY SIGNED 11/13/2011 10:33

## 2014-08-23 NOTE — Consult Note (Signed)
PATIENT NAME:  Deborah Watson, NOACK MR#:  [id number] DATE OF BIRTH:  05/19/1960  DATE OF CONSULTATION:  11/07/2011  REFERRING PHYSICIAN:   CONSULTING PHYSICIAN:  Lurline Del, MD  PRIMARY CARE PHYSICIAN: Dr. Renata Caprice   REASON FOR CONSULTATION: Nausea and vomiting.   HISTORY OF PRESENT ILLNESS: 54 year old female with history of significant alcohol abuse, tobacco abuse, depression, anxiety and erosive esophagitis. Patient was admitted yesterday with 3 to 4 day history of persistent nausea and vomiting. She is unable to keep anything down. She was dehydrated and acidotic. Patient also had leukocytosis with a white cell count of 14.7. There was some question of history of hematemesis, although her hemoglobin was normal yesterday and remains normal as of today. Patient was interviewed. She appears sedated and sleepy. She is complaining of feeling nauseated. According to the caretaker she is not being able to really eat or drink and has been throwing up frequently today. Patient denies any diarrhea. Denies any melena or hematochezia.   PAST MEDICAL HISTORY:  1. Depression, anxiety. 2. History of gastroesophageal reflux disease with erosive esophagitis documented in February 2012.  3. History of polysubstance abuse.   FAMILY HISTORY: Positive for diabetes.   SOCIAL HISTORY: She drinks 3 to 4 beers a day. Smokes about 1-1/2 packs of tobacco.   HOME MEDICATIONS:  1. Celexa. 2. Trazodone.   ALLERGIES: None.   REVIEW OF SYSTEMS: Grossly negative except for what is mentioned in the History of Present Illness.   PHYSICAL EXAMINATION:  GENERAL: Patient appears to be sleepy but easily arousable, does not appear to be toxic or septic. Does not appear to be in any acute distress.   VITAL SIGNS: Temperature 98.4, pulse 83, respirations 18, blood pressure 171/76. Clinically she is not jaundiced.   NECK: Neck veins are flat.   LUNGS: Grossly clear to auscultation bilaterally with fair air entry and no  added sounds.   CARDIOVASCULAR: Regular rate and rhythm. No gallops or murmur.   ABDOMEN: Quite soft and benign. An umbilical ring was noted. Bowel sounds are positive. No significant hepatosplenomegaly or ascites. No clinical Murphy's sign.   NEUROLOGIC: Except for her lethargy appears to be unremarkable.   LABORATORY, DIAGNOSTIC AND RADIOLOGICAL DATA: Ultrasound showed gallstones without gallbladder wall thickening or pericholecystic fluid. Urinalysis is unremarkable. White cell count has actually gone up to 16,000, hemoglobin 12.7, hematocrit 38.1, platelet count 208. BUN and creatinine are normal as well as other electrolytes. Serum lipase 174. Liver enzymes are unremarkable.   ASSESSMENT AND PLAN: Patient with persistent nausea and vomiting which is probably related to alcoholic gastritis and/or esophagitis which was documented about a year ago on an EGD. Her abdominal examination is quite benign. She does have gallstones, although there does not appear to be any evidence of acute cholecystitis. I would treat her symptomatically with high dose proton pump inhibitor as well as Zofran p.r.n. for nausea and vomiting. If patient's condition does not improve in the next 48 hours or so then an upper gastrointestinal endoscopy can be considered although an upper GI endoscopy would likely not change the management. Patient does have cholelithiasis and again if nausea and vomiting continues I would recommend a surgical consultation. Her physical examination as well as abdominal examination is quite benign. Patient does have leukocytosis, although this is not uncommon in patients with history of recent alcohol intake and I would recommend following her CBC on a daily basis. If the leukocytosis does not improve as well as her symptoms then I would recommend  a CT scan of the abdomen and pelvis for further evaluation.   Will follow.   ____________________________ Lurline DelShaukat Cleaven Demario, MD si:cms D: 11/07/2011  18:34:13 ET T: 11/08/2011 07:18:49 ET JOB#: [id number]  cc: Lurline DelShaukat Airen Stiehl, MD, <Dictator>  Lurline DelSHAUKAT Dafney Farler MD ELECTRONICALLY SIGNED 11/10/2011 15:08

## 2014-08-23 NOTE — Consult Note (Signed)
Chief Complaint:   Subjective/Chief Complaint Overall looks better. No vomiting reported today. No BM.   VITAL SIGNS/ANCILLARY NOTES: **Vital Signs.:   10-Jul-13 11:17   Temperature Temperature (F) 98.5   Celsius 36.9   Temperature Source oral   Pulse Pulse 77   Respirations Respirations 18   Systolic BP Systolic BP 595   Diastolic BP (mmHg) Diastolic BP (mmHg) 74   Mean BP 94   Pulse Ox % Pulse Ox % 95   Pulse Ox Activity Level  At rest   Oxygen Delivery Room Air/ 21 %   Brief Assessment:   Additional Physical Exam Abdomen is soft and non tender.   Lab Results: Routine Chem:  10-Jul-13 04:09    Glucose, Serum 80   BUN  21   Creatinine (comp) 0.96   Sodium, Serum 143   Potassium, Serum  3.1   Chloride, Serum 105   CO2, Serum 27   Calcium (Total), Serum 8.5   Anion Gap 11   Osmolality (calc) 287   eGFR (African American) >60   eGFR (Non-African American) >60 (eGFR values <36m/min/1.73 m2 may be an indication of chronic kidney disease (CKD). Calculated eGFR is useful in patients with stable renal function. The eGFR calculation will not be reliable in acutely ill patients when serum creatinine is changing rapidly. It is not useful in  patients on dialysis. The eGFR calculation may not be applicable to patients at the low and high extremes of body sizes, pregnant women, and vegetarians.)  Routine Hem:  10-Jul-13 04:09    WBC (CBC)  12.1   RBC (CBC) 4.00   Hemoglobin (CBC)  11.8   Hematocrit (CBC)  34.8   Platelet Count (CBC) 175   MCV 87   MCH 29.4   MCHC 33.8   RDW  14.6   Neutrophil % 67.9   Lymphocyte % 24.7   Monocyte % 6.8   Eosinophil % 0.3   Basophil % 0.3   Neutrophil #  8.2   Lymphocyte # 3.0   Monocyte # 0.8   Eosinophil # 0.0   Basophil # 0.0 (Result(s) reported on 08 Nov 2011 at 06:45AM.)   Assessment/Plan:  Assessment/Plan:   Assessment Nausea and vomiting most likely related to ETOH induced gastritis and esophagitis.  Better. Leucocytosis, improving. Abdominal examination is benign. ? GI bleed. No signs of active GI bleed and H and H is stable.    Plan Continue PPI. Will follow.   Electronic Signatures: IJill Side(MD)  (Signed 10-Jul-13 13:09)  Authored: Chief Complaint, VITAL SIGNS/ANCILLARY NOTES, Brief Assessment, Lab Results, Assessment/Plan   Last Updated: 10-Jul-13 13:09 by IJill Side(MD)

## 2014-08-23 NOTE — Consult Note (Signed)
Brief Consult Note: Diagnosis: Nausea and vomiting.   Patient was seen by consultant.   Consult note dictated.   Comments: Please see dictated consult.  Electronic Signatures: Lurline DelIftikhar, [place] Vandergriff (MD)  (Signed 09-Jul-13 18:28)  Authored: Brief Consult Note   Last Updated: 09-Jul-13 18:28 by Lurline DelIftikhar, Zyair Rhein (MD)

## 2014-08-23 NOTE — Op Note (Signed)
PATIENT NAME:  Deborah GrayFOLLIETT, Bianca K MR#:  [id number] DATE OF BIRTH:  09/06/1960  DATE OF PROCEDURE:  11/11/2011  OPERATION PERFORMED: Laparoscopic cholecystectomy.   PREOPERATIVE DIAGNOSIS: Symptomatic cholelithiasis.   POSTOPERATIVE DIAGNOSIS: Symptomatic cholelithiasis.   SURGEON: Claude MangesWilliam F. Ikram Riebe, MD   ANESTHESIA: General.   PROCEDURE IN DETAIL: The patient was placed supine on the operating room table and prepped and draped in the usual sterile fashion. A 15 mmHg CO2 pneumoperitoneum was created via a Veress needle in the infraumbilical position and this was replaced with a 5 mm trocar and a 30 degree angled laparoscope. The remaining trocars were placed under direct visualization. The fundus of the gallbladder was retracted superiorly and ventrally and adhesions to the visceral surface of the gallbladder were taken down bluntly with the aid of the electrocautery and the infundibulum was retracted laterally and superiorly opening up the triangle of Calot. Distances within the triangle were fairly long. Dissection there revealed a clear cystic duct that went directly into the infundibulum of the gallbladder. This was triply clipped and divided. There were two cystic arteries, one anterior and one posterior. Each was triply clipped and divided. The gallbladder was removed from the liver bed and placed in an EndoCatch bag and extracted from the abdomen via the epigastric port. This port site fascia was closed with a single 0 Vicryl suture using the laparoscopic puncture closure device. The peritoneum was then desufflated and decannulated and all four skin sites were closed with subcuticular 5-0 Monocryl and suture strips. The patient tolerated the procedure well. There were no complications.   ____________________________ Claude MangesWilliam F. Midas Daughety, MD wfm:drc D: 11/11/2011 14:25:00 ET T: 11/11/2011 14:47:32 ET JOB#: [id number]  cc: Claude MangesWilliam F. Treylon Henard, MD, <Dictator> Claude MangesWILLIAM F Liliana Dang MD ELECTRONICALLY  SIGNED 11/13/2011 14:06

## 2014-08-23 NOTE — H&P (Signed)
PATIENT NAME:  Deborah Watson, KIRCHOFF MR#:  [id number] DATE OF BIRTH:  1960/07/27  DATE OF ADMISSION:  11/06/2011  REFERRING PHYSICIAN: Dr. Manson Passey    PRIMARY CARE PHYSICIAN: Dr. Renata Caprice     CHIEF COMPLAINT: Nausea and vomiting.   HISTORY OF PRESENT ILLNESS: The patient is a 54 year old female with a history of significant alcohol abuse, tobacco abuse, anxiety, and depression who presents with multiple days of nausea and vomiting. The patient has been drinking 3 to 4 beers a day. Last beer was yesterday. Since yesterday the nausea and vomiting has progressed. She has had eight episodes of vomiting in the last 24 hours and feels like she cannot keep anything down. She has no abdominal pain. On arrival she was noted to be acidotic with CO2 serum of 16. Lactate was checked. It is only 1.7. The patient also has a mild renal insufficiency and the hospitalist service was contacted for further evaluation and management.   PAST MEDICAL HISTORY:  1. Depression. 2. Anxiety. 3. Gastroesophageal reflux disease.  4. Per chart she has severe esophagitis in the past with esophageal ulcers. 5. Polysubstance abuse including tobacco, alcohol, and pot.   FAMILY HISTORY: Mother's side with diabetes.   SOCIAL HISTORY: Drinks 3 to 4 beers a day. Last drink was yesterday. Does smoke 1-1/2 packs of tobacco. Smokes pot. Does not work.   CURRENT MEDICATIONS:  1. Celexa 20 mg daily.  2. Trazodone 150 mg daily.  3. Her primary care physician has stopped giving her Xanax for six weeks.   ALLERGIES: No known drug allergies.  REVIEW OF SYSTEMS: CONSTITUTIONAL: Denies fevers but has weakness and overall feels unwell. EYES: No blurry vision or double vision. ENT: No tinnitus or hearing loss. RESPIRATORY: No cough, wheezing, or shortness of breath. No COPD history. CARDIOVASCULAR: No chest pain. No lower extremity edema. No history of high blood pressure. GI: Nausea and vomiting. No diarrhea. No abdominal pain. The patient did  have a couple bouts of blood-tinged vomitus in the last 24 hours. Vomitus is brownish. No bright red blood per rectum. No melena. GU: Occasional dysuria. No increased frequency. ENDOCRINE: Denies polyuria or nocturia. No thyroid problems. HEME/LYMPH: Denies anemia or easy bruising. SKIN: Denies any rashes. MUSCULOSKELETAL: Denies arthritis or gout. NEUROLOGIC: Global weakness without focal weakness. No history of CVA or TIA. PSYCHIATRIC: Has anxiety and depression.   PHYSICAL EXAMINATION:   VITAL SIGNS: Temperature on arrival 96.5, initial pulse rate 102, currently 88, respiratory rate 18, blood pressure currently 170/88, oxygen sat 99% on room air.   GENERAL: The patient is an unkempt female laying in bed in no obvious distress, malodorous.   HEENT: Normocephalic, atraumatic. Pupils are equal and reactive. Dry mucous membranes. Extraocular muscles intact. Poor dentition.   NECK: Supple. No thyroid tenderness.   CARDIOVASCULAR: S1, S2 regular rate and rhythm. No murmurs, rubs, or gallops.   LUNGS: Clear to auscultation without rhonchi or wheezing.   ABDOMEN: Soft, nontender, nondistended. Normoactive bowel sounds in all quadrants.   EXTREMITIES: No significant lower extremity edema.   SKIN: No obvious rashes.   NEUROLOGIC: Cranial nerves II through XII grossly intact. Strength is 5 out of 5 in all extremities. Sensation is intact to light touch.   PSYCH: Slow to respond at times but appropriate. Awake, alert, oriented x3.   LABORATORY, DIAGNOSTIC, AND RADIOLOGICAL DATA: BUN 22, creatinine 1.32, sodium 133, glucose 148, chloride 93, CO2 16, anion gap 24, total protein 9.2, otherwise LFTs within normal limits. Lipase 174. WBC 14.1, hemoglobin  15.6, hematocrit 46.3, platelets 296. Urinalysis 2+ ketones, no nitrites, trace leukocyte esterase, 2 WBCs. Lactic acid 1.7.   Abdominal ultrasound shows cholelithiasis without sonographic evidence of acute cholecystitis.   ASSESSMENT AND PLAN: We  have a 54 year old Caucasian female with history of anxiety, depression, OCD, history of severe esophagitis with EGD done last year, history of cholelithiasis and cholecystitis in the past with polysubstance abuse who presents with nausea and vomiting worse in the last 24 hours and found with likely alcoholic ketoacidosis, renal failure, hypokalemia. Will admit her to the hospital.  1. In regards to the nausea and vomiting, the patient appears to be dehydrated. It is possibly alcohol-induced gastritis and esophagitis. The patient does have significant acidosis with elevation of anion gap likely from alcohol. Lactate is not significantly elevated to cause that. The patient also, furthermore, has no evidence for DKA. She has ketones in the urine. At this point we would start her on aggressive IV fluid resuscitation. She has been started on a banana bag already. Would start her on PPI IV b.i.d. and start her on Zofran. Would also consult with GI as she has had severe esophagitis in the past. The patient also has been having some hematemesis recently, however, the hemoglobin is stable. The current hemoglobin likely is more hemoconcentrated and likely would come down with IV fluids. Nonetheless, she has no acute bleed currently. Her previous hemoglobin was January of this year being 12.7.  2. The patient does have renal failure as well. This likely is in the setting of GI loss and volume depletion and dehydration. Would replete with IV fluids and monitor this in the morning. Would consider Nephrology consult if it does not improve with IV fluids.  3. Hypokalemia. This likely is in the setting of GI losses. We would provide repletion and recheck it in the morning.  4. Alcohol abuse. The patient will be started on CIWA protocol. She has no tremors. She has been given Ativan 1 mg IV in the ER. A banana bag has been started.  5. Depression. Would continue her outpatient medications.  6. Leukocytosis. The patient has no  fever. She does have SIRS criteria with tachycardia on arrival as well as leukocytosis. Possibly this is reactive. She has some dysuria, however, the urinalysis is not strongly suggestive of infection. We would not start antibiotics but send urine cultures for now. She has no upper respiratory symptoms at this point. Would start her on TEDs and SCDs for DVT prophylaxis.   TOTAL TIME SPENT: 55 minutes.   CODE STATUS: The patient is FULL CODE.   ____________________________ Krystal EatonShayiq Ceferino Lang, MD sa:drc D: 11/06/2011 20:04:48 ET T: 11/07/2011 06:46:35 ET JOB#: [id number]  cc: Krystal EatonShayiq Christabella Alvira, MD, <Dictator> Dr. Jacqlyn Kraussonrad   Kinley Ferrentino [facility] PaHMADZIA MD ELECTRONICALLY SIGNED 11/22/2011 12:28

## 2014-09-01 IMAGING — CT CT CHEST W/ CM
2 of 4 series · 15 of 36 positions shown, 18 images · IV contrast (isovue)
Comparison: None.

CLINICAL DATA: Metastatic adenocarcinoma to neck of unknown
primary.

EXAM:
CT CHEST WITH CONTRAST
TECHNIQUE: Multidetector CT imaging of the chest was performed during
intravenous contrast administration.
CONTRAST:  75 mL Isovue 370

[Series 2: routine chest with · axial · 0.70mm/px · z∈[-633,-338]mm · 12 of 71 slices shown, 15 images]
[im 6/71  mediastinal]
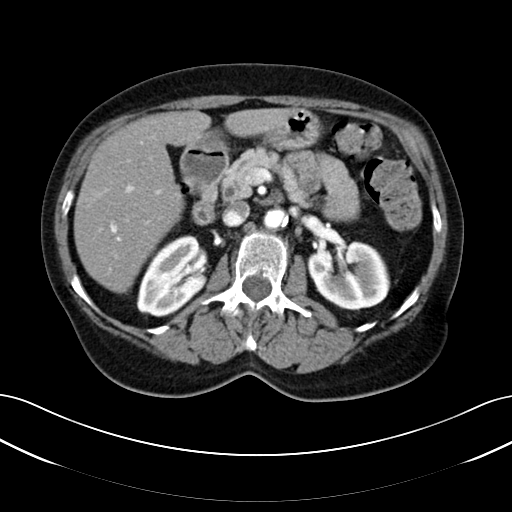
[im 6/71  lung]
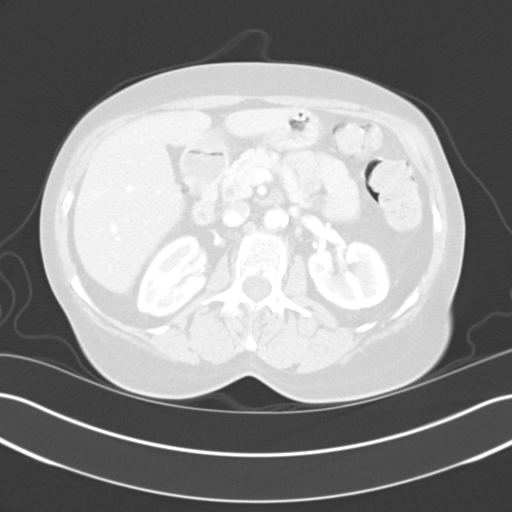
[im 11/71  lung]
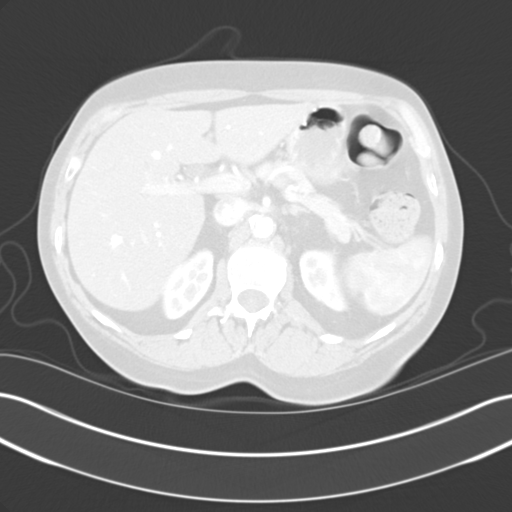
[im 17/71  lung]
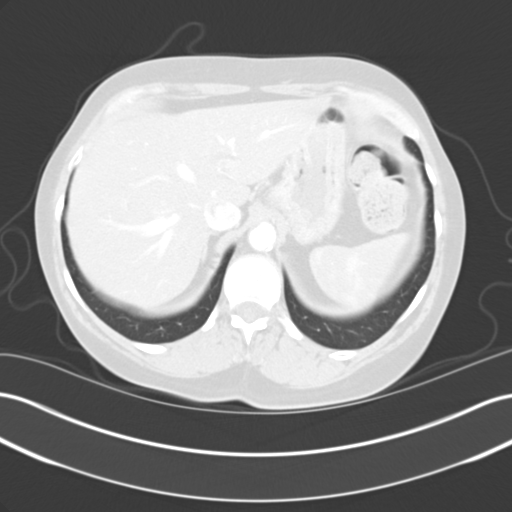
[im 22/71  lung]
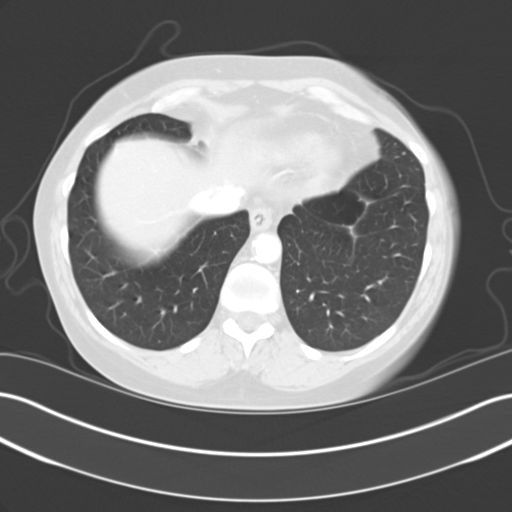
[im 27/71  mediastinal]
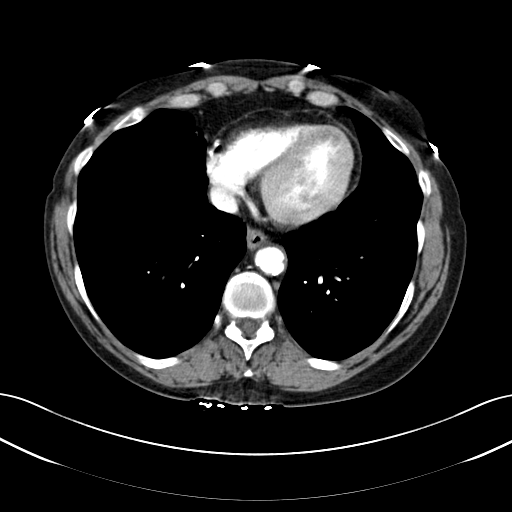
[im 27/71  lung]
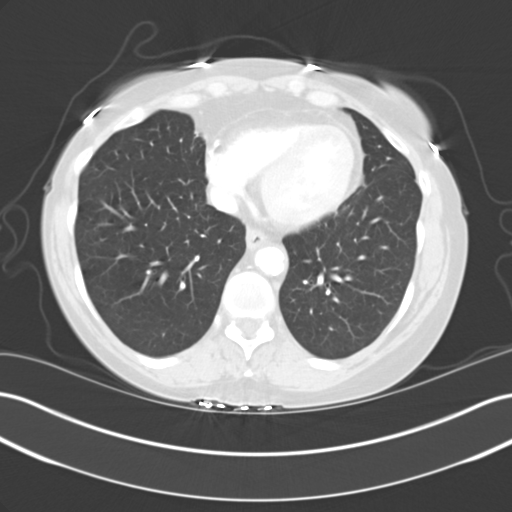
[im 33/71  lung]
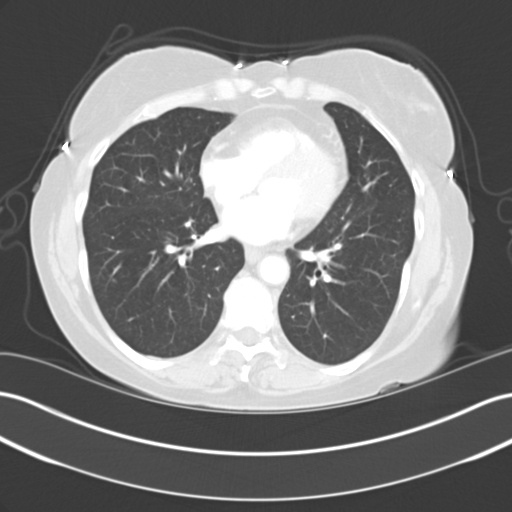
[im 38/71  lung]
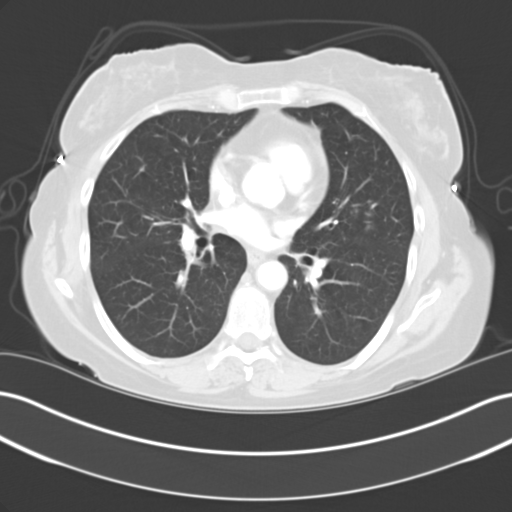
[im 44/71  lung]
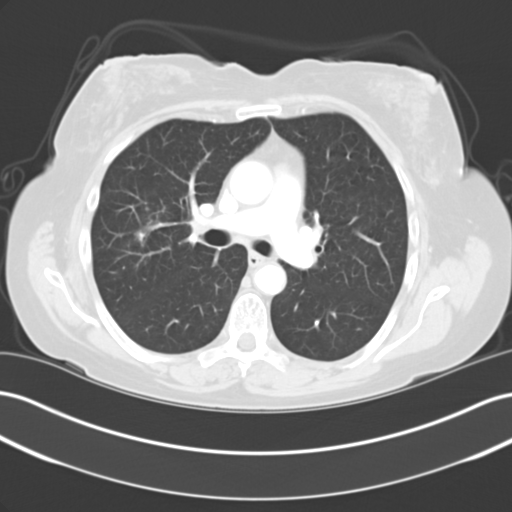
[im 49/71  mediastinal]
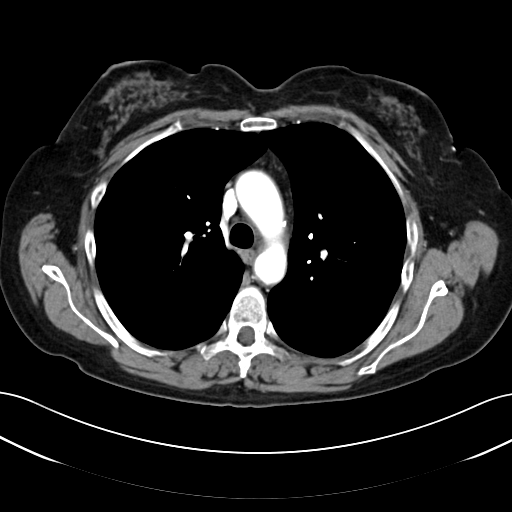
[im 49/71  lung]
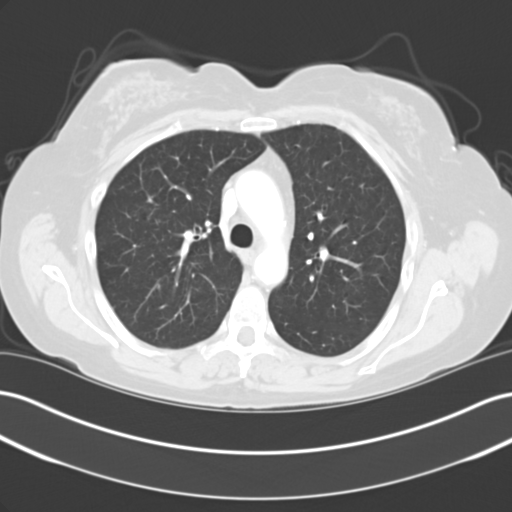
[im 54/71  lung]
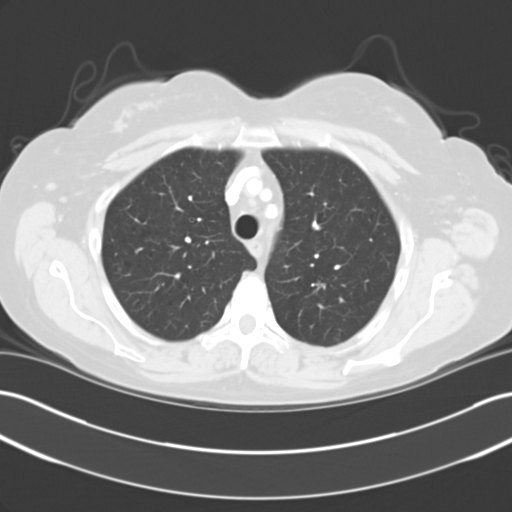
[im 60/71  lung]
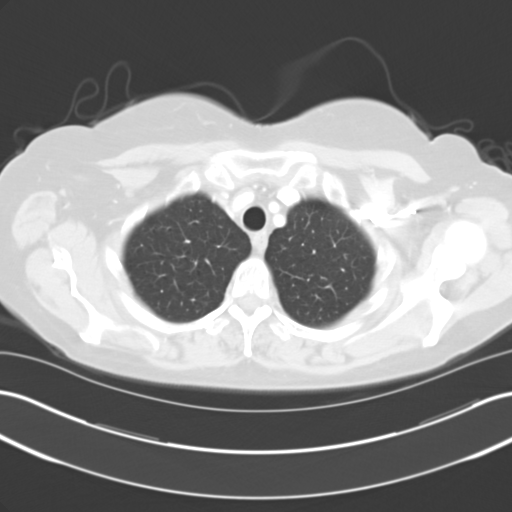
[im 65/71  lung]
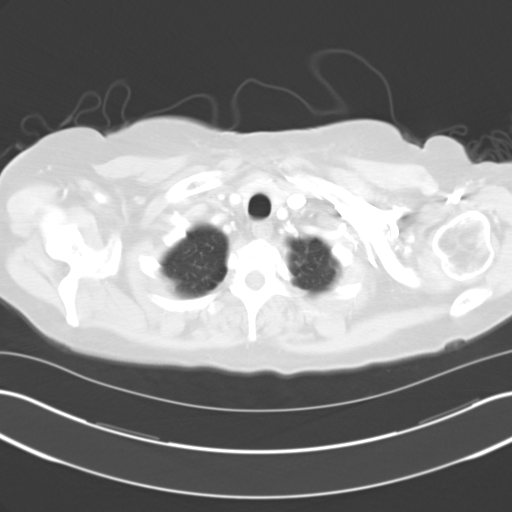

[Series 5: cor routine chest with · coronal · 0.67mm/px · 3 of 124 slices shown]
[im 25/124  lung]
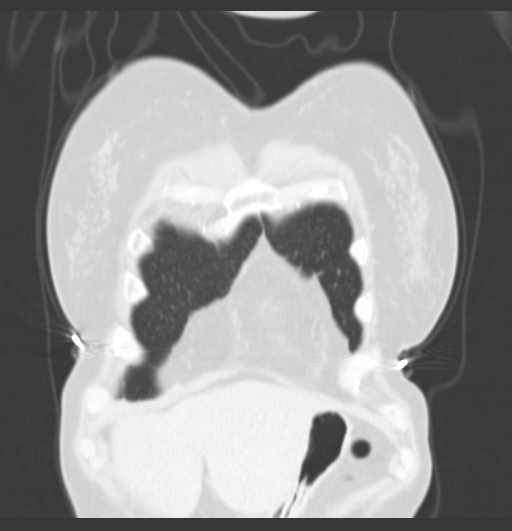
[im 50/124  lung]
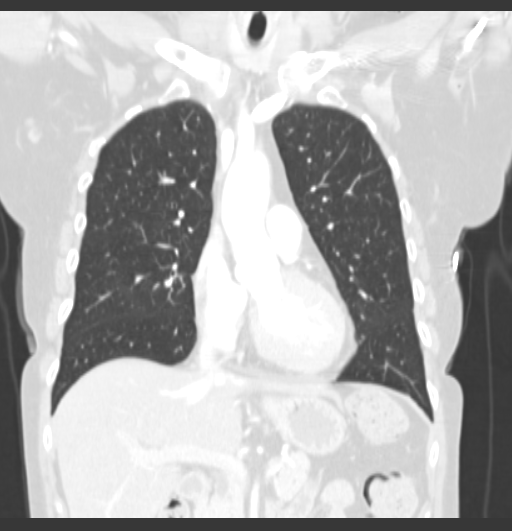
[im 74/124  lung]
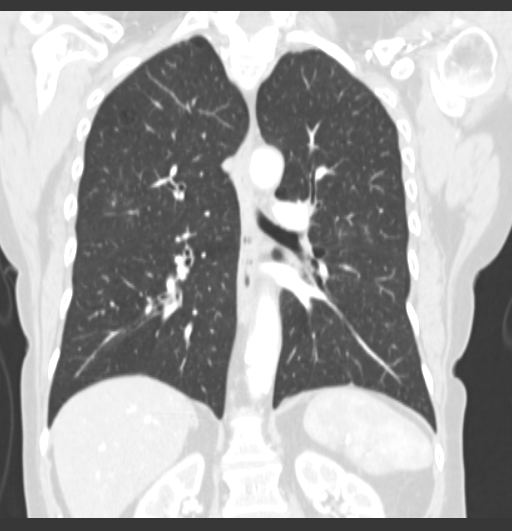

[15 of 36 positions shown; findings below may reference images not displayed]

FINDINGS: No evidence of mediastinal or hilar masses. No adenopathy seen
elsewhere within the thorax. No evidence of pleural or pericardial
effusion.

Mild centrilobular emphysema noted. Scarring and mild traction
bronchiectasis noted in the central right upper lobe. Mild nodular
interstitial thickening is also seen in the central lingula which is
likely postinflammatory in etiology. No evidence of pulmonary
airspace disease or consolidation. No suspicious pulmonary nodules
or masses are identified.

Both adrenal glands are normal in appearance. No suspicious bone
lesions identified.
IMPRESSION: No evidence of primary carcinoma or metastatic disease within the
thorax.

Mild emphysema and chronic postinflammatory changes in the upper
lobes.

## 2015-03-31 IMAGING — CR DG CHEST 2V
1 series · 2 of 2 positions shown · non-contrast
Comparison: Chest x-ray dated 01/29/2014 and 04/23/2013 and chest
CT dated 07/29/2013

CLINICAL DATA: Nausea and anxiety.

EXAM:
CHEST  2 VIEW

[Series 1: dxr chest pa (or ap) and lateral · 0.14mm/px · 2 of 2 slices shown]
[im 1/2]
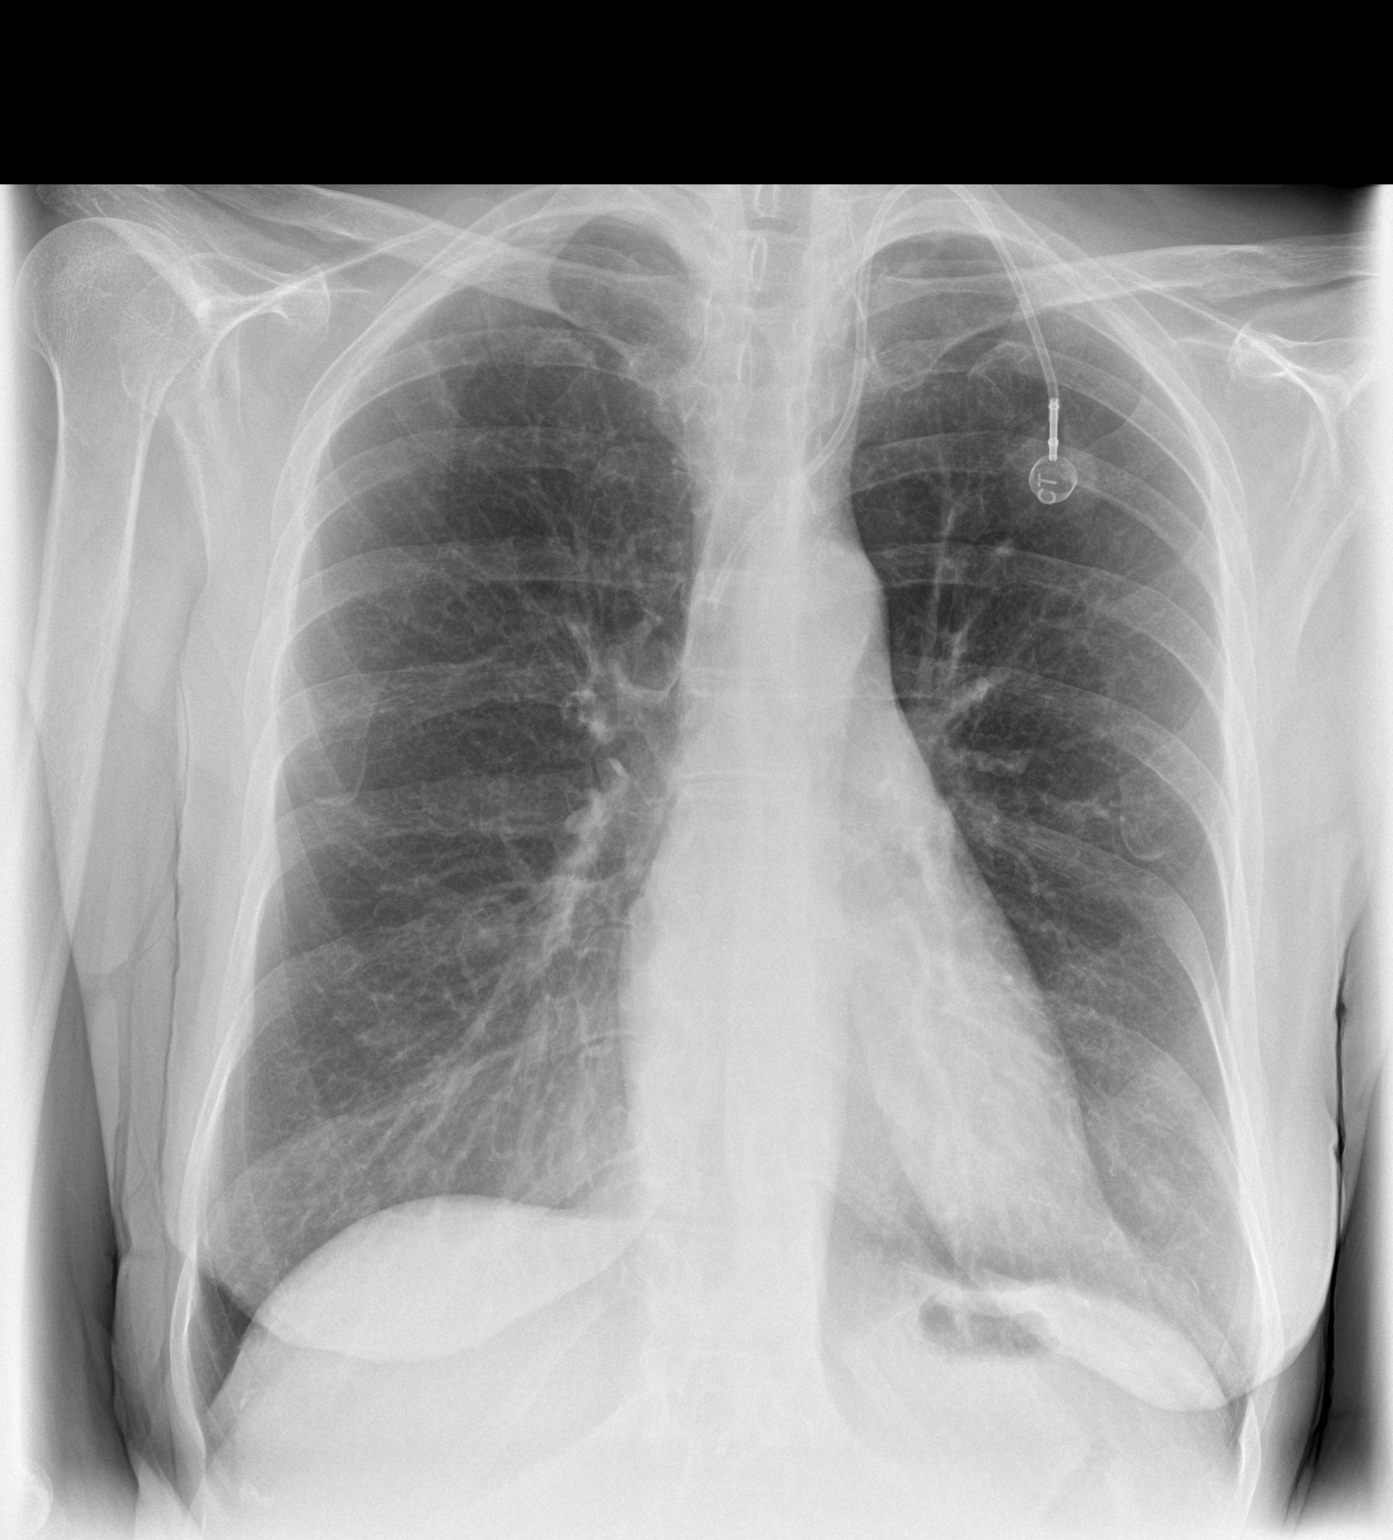
[im 2/2]
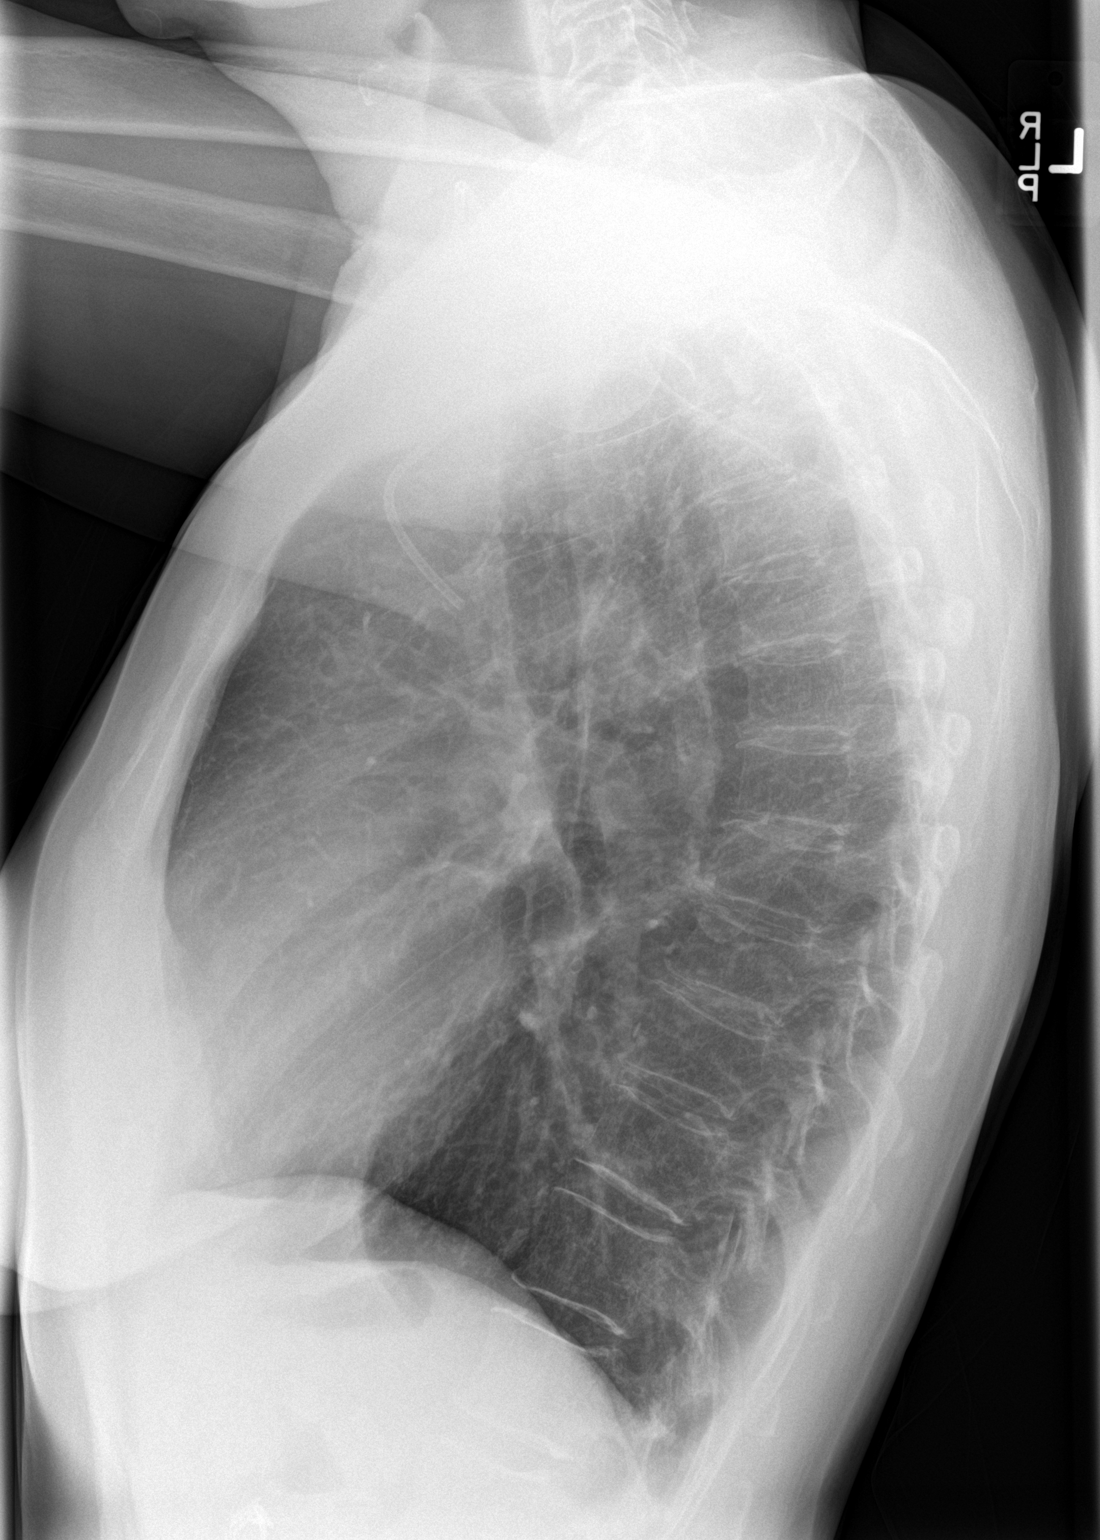

[2 of 2 positions shown; findings below may reference images not displayed]

FINDINGS: Power port appears in good position at the level of the aortic arch
in the superior vena cava. Lungs are hyperinflated consistent with
previously demonstrated emphysema. No infiltrates or effusions.
Heart size and pulmonary vascularity are normal.
IMPRESSION: Emphysema.  No acute abnormalities.
